# Patient Record
Sex: Male | Born: 1970 | Race: Black or African American | Hispanic: No | Smoking: Former smoker
Health system: Southern US, Community
[De-identification: ages and names within clinical notes are randomized; demographics above are authoritative.]

## PROBLEM LIST (undated history)

## (undated) ENCOUNTER — Ambulatory Visit (HOSPITAL_COMMUNITY): Admission: EM | Payer: 59 | Source: Home / Self Care

## (undated) DIAGNOSIS — I10 Essential (primary) hypertension: Secondary | ICD-10-CM

## (undated) DIAGNOSIS — M1990 Unspecified osteoarthritis, unspecified site: Secondary | ICD-10-CM

## (undated) DIAGNOSIS — F32A Depression, unspecified: Secondary | ICD-10-CM

## (undated) DIAGNOSIS — M5430 Sciatica, unspecified side: Secondary | ICD-10-CM

## (undated) DIAGNOSIS — G4730 Sleep apnea, unspecified: Secondary | ICD-10-CM

## (undated) DIAGNOSIS — F1910 Other psychoactive substance abuse, uncomplicated: Secondary | ICD-10-CM

## (undated) DIAGNOSIS — M549 Dorsalgia, unspecified: Secondary | ICD-10-CM

## (undated) DIAGNOSIS — F329 Major depressive disorder, single episode, unspecified: Secondary | ICD-10-CM

## (undated) DIAGNOSIS — J45909 Unspecified asthma, uncomplicated: Secondary | ICD-10-CM

## (undated) HISTORY — DX: Unspecified osteoarthritis, unspecified site: M19.90

## (undated) HISTORY — DX: Sleep apnea, unspecified: G47.30

## (undated) HISTORY — PX: OTHER SURGICAL HISTORY: SHX169

## (undated) HISTORY — DX: Other psychoactive substance abuse, uncomplicated: F19.10

---
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
## 2008-07-12 ENCOUNTER — Emergency Department (HOSPITAL_COMMUNITY): Admission: EM | Admit: 2008-07-12 | Discharge: 2008-07-12 | Payer: Self-pay | Admitting: Emergency Medicine

## 2009-04-20 ENCOUNTER — Ambulatory Visit (HOSPITAL_COMMUNITY): Admission: RE | Admit: 2009-04-20 | Discharge: 2009-04-20 | Payer: Self-pay | Admitting: Orthopedic Surgery

## 2009-04-20 ENCOUNTER — Emergency Department (HOSPITAL_COMMUNITY): Admission: EM | Admit: 2009-04-20 | Discharge: 2009-04-20 | Payer: Self-pay | Admitting: Emergency Medicine

## 2010-05-01 LAB — BASIC METABOLIC PANEL
BUN: 9 mg/dL (ref 6–23)
Glucose, Bld: 90 mg/dL (ref 70–99)

## 2010-05-01 LAB — CBC
HCT: 39.5 % (ref 39.0–52.0)
Hemoglobin: 13.2 g/dL (ref 13.0–17.0)
RBC: 4.44 MIL/uL (ref 4.22–5.81)

## 2010-05-01 LAB — CULTURE, ROUTINE-ABSCESS

## 2010-05-01 LAB — ANAEROBIC CULTURE

## 2010-08-17 ENCOUNTER — Emergency Department (HOSPITAL_COMMUNITY): Payer: Self-pay

## 2010-08-17 ENCOUNTER — Emergency Department (HOSPITAL_COMMUNITY)
Admission: EM | Admit: 2010-08-17 | Discharge: 2010-08-17 | Payer: Self-pay | Attending: Emergency Medicine | Admitting: Emergency Medicine

## 2010-08-17 DIAGNOSIS — R05 Cough: Secondary | ICD-10-CM | POA: Insufficient documentation

## 2010-08-17 DIAGNOSIS — J069 Acute upper respiratory infection, unspecified: Secondary | ICD-10-CM | POA: Insufficient documentation

## 2010-08-17 DIAGNOSIS — J40 Bronchitis, not specified as acute or chronic: Secondary | ICD-10-CM | POA: Insufficient documentation

## 2010-08-17 DIAGNOSIS — R059 Cough, unspecified: Secondary | ICD-10-CM | POA: Insufficient documentation

## 2010-08-17 DIAGNOSIS — J029 Acute pharyngitis, unspecified: Secondary | ICD-10-CM | POA: Insufficient documentation

## 2010-08-17 LAB — RAPID STREP SCREEN (MED CTR MEBANE ONLY): Streptococcus, Group A Screen (Direct): NEGATIVE

## 2011-04-03 ENCOUNTER — Emergency Department (HOSPITAL_COMMUNITY)
Admission: EM | Admit: 2011-04-03 | Discharge: 2011-04-03 | Disposition: A | Discharge status: Home / Self Care | Payer: Self-pay | Attending: Emergency Medicine | Admitting: Emergency Medicine

## 2011-04-03 ENCOUNTER — Encounter (HOSPITAL_COMMUNITY): Payer: Self-pay | Admitting: *Deleted

## 2011-04-03 ENCOUNTER — Emergency Department (HOSPITAL_COMMUNITY): Payer: Self-pay

## 2011-04-03 DIAGNOSIS — M545 Low back pain, unspecified: Secondary | ICD-10-CM | POA: Insufficient documentation

## 2011-04-03 DIAGNOSIS — J45909 Unspecified asthma, uncomplicated: Secondary | ICD-10-CM | POA: Insufficient documentation

## 2011-04-03 DIAGNOSIS — F17200 Nicotine dependence, unspecified, uncomplicated: Secondary | ICD-10-CM | POA: Insufficient documentation

## 2011-04-03 MED ORDER — METHOCARBAMOL 500 MG PO TABS: 1000.0000 mg | ORAL_TABLET | Freq: Four times a day (QID) | ORAL | Status: AC | PRN

## 2011-04-03 MED ORDER — NAPROXEN 250 MG PO TABS: 250.0000 mg | ORAL_TABLET | Freq: Two times a day (BID) | ORAL | Status: DC

## 2011-04-03 MED ORDER — HYDROCODONE-ACETAMINOPHEN 5-325 MG PO TABS: ORAL_TABLET | ORAL | Status: AC

## 2011-04-03 NOTE — ED Provider Notes (Signed)
History     CSN: [id number]  Arrival date & time 04/03/11  1045   First MD Initiated Contact with Patient 04/03/11 1114      Chief Complaint  Patient presents with  . Back Pain     HPI Pt was seen at 1110.  Per pt, c/o gradual onset and persistence of constant acute flair of his chronic low back "pain" for the past several days.  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  State the Prison MD treated him with muscle relaxant and motrin with partial relief of his symptoms.  Staets the pain with occas radiate down his left leg.  Describes the pain as "sharp."  Denies any new injury.  Denies incont/retention of bowel or bladder, no saddle anesthesia, no focal motor weakness, no tingling/numbness in extremities, no fevers, no injury.   The symptoms have been associated with no other complaints.    Past Medical History  Diagnosis Date  . Asthma     History reviewed. No pertinent past surgical history.  History  Substance Use Topics  . Smoking status: Current Everyday Smoker  . Smokeless tobacco: Not on file  . Alcohol Use: Yes    Review of Systems ROS: Statement: All systems negative except as marked or noted in the HPI; Constitutional: Negative for fever and chills. ; ; Eyes: Negative for eye pain, redness and discharge. ; ; ENMT: Negative for ear pain, hoarseness, nasal congestion, sinus pressure and sore throat. ; ; Cardiovascular: Negative for chest pain, palpitations, diaphoresis, dyspnea and peripheral edema. ; ; Respiratory: Negative for cough, wheezing and stridor. ; ; Gastrointestinal: Negative for nausea, vomiting, diarrhea, abdominal pain, blood in stool, hematemesis, jaundice and rectal bleeding. . ; ; Genitourinary: Negative for dysuria, flank pain and hematuria. ; ; Musculoskeletal: +LBP.  Negative for neck pain. Negative for swelling and trauma.; ; Skin: Negative for pruritus, rash, abrasions, blisters, bruising and skin lesion.; ;  Neuro: Negative for headache, lightheadedness and neck stiffness. Negative for weakness, altered level of consciousness , altered mental status, extremity weakness, paresthesias, involuntary movement, seizure and syncope.     Allergies  Review of patient's allergies indicates no known allergies.  Home Medications   Current Outpatient Rx  Name Route Sig Dispense Refill  . ALBUTEROL SULFATE HFA 108 (90 BASE) MCG/ACT IN AERS Inhalation Inhale 4 puffs into the lungs every 6 (six) hours as needed. For shortness of breath/wheezing    . CYCLOBENZAPRINE HCL 5 MG PO TABS Oral Take 5 mg by mouth 3 (three) times daily as needed. For spasms    . IBUPROFEN 200 MG PO TABS Oral Take 1,000 mg by mouth every 8 (eight) hours as needed. For pain    . TRAMADOL HCL 50 MG PO TABS Oral Take 50 mg by mouth every 6 (six) hours as needed. For pain      BP 139/89  Pulse 79  Temp(Src) 98.3 F (36.8 C) (Oral)  Resp 20  Ht 5\' 9"  (1.753 m)  Wt 150 lb (68.04 kg)  BMI 22.15 kg/m2  SpO2 98%  Physical Exam 1115: Physical examination:  Nursing notes reviewed; Vital signs and O2 SAT reviewed;  Constitutional: Well developed, Well nourished, Well hydrated, In no acute distress; Head:  Normocephalic, atraumatic; Eyes: EOMI, PERRL, No scleral icterus; ENMT: Mouth and pharynx normal, Mucous membranes moist; Neck: Supple, Full range of motion, No lymphadenopathy; Cardiovascular: Regular rate and rhythm, No murmur, rub, or gallop; Respiratory: Breath sounds clear &  equal bilaterally, No rales, rhonchi, wheezes, or rub, Normal respiratory effort/excursion; Chest: Nontender, Movement normal; Abdomen: Soft, Nontender, Nondistended, Normal bowel sounds; Genitourinary: No CVA tenderness; Spine:  No midline CS, TS, LS tenderness. +TTP left lumbar hypertonic paraspinal muscles.; Extremities: Pulses normal, No tenderness, No edema, No calf edema or asymmetry.; Neuro: AA&Ox3, Major CN grossly intact. Gait steady, Strength 5/5 equal bilat  UE's and LE's, including great toe dorsiflexion.  DTR 2/4 equal bilat UE's and LE's.  No gross sensory deficits.  Neg straight leg raises bilat.; Skin: Color normal, Warm, Dry, no rash.    ED Course  Procedures    MDM  MDM Reviewed: nursing note and vitals Interpretation: x-ray    Dg Lumbar Spine Complete 04/03/2011  *RADIOLOGY REPORT*  Clinical Data: Weight lifting injury.  Low back pain.  LUMBAR SPINE - COMPLETE 4+ VIEW  Comparison: None.  Findings: Vertebral body height and alignment are normal. Intervertebral disc space height is maintained.  No pars interarticularis defect is seen.  Paraspinous structures are unremarkable.  Mild convex left curvature may be positional.  IMPRESSION: Negative exam.  Original Report Authenticated By: Bernadene Bell. D'ALESSIO, M.D.     12:35 PM:  No focal motor weakness or sensory deficit on today's exam.  No concerning signs for cauda equina today.  XR without acute bony abnl.  Symptoms have been present for the past month.  Will continue to treat symptomatically and refer to Neurosurgery.  Dx testing d/w pt.  Questions answered.  Verb understanding, agreeable to d/c home with outpt f/u.           Laray Anger, DO 04/05/11 1622

## 2011-04-03 NOTE — ED Notes (Signed)
Patient states he was doing dead weight lifts and felt something "explode in his back" approx 1 mth ago.  He is also experiencing problems in his left leg,  States it hurts and sometimes goes numb.  Patient also has pain when he bends his neck.

## 2011-04-24 ENCOUNTER — Encounter (HOSPITAL_COMMUNITY): Payer: Self-pay | Admitting: Emergency Medicine

## 2011-04-24 ENCOUNTER — Emergency Department (HOSPITAL_COMMUNITY)
Admission: EM | Admit: 2011-04-24 | Discharge: 2011-04-24 | Disposition: A | Discharge status: Home / Self Care | Payer: Self-pay | Attending: Emergency Medicine | Admitting: Emergency Medicine

## 2011-04-24 DIAGNOSIS — M546 Pain in thoracic spine: Secondary | ICD-10-CM

## 2011-04-24 DIAGNOSIS — J45909 Unspecified asthma, uncomplicated: Secondary | ICD-10-CM | POA: Insufficient documentation

## 2011-04-24 DIAGNOSIS — M549 Dorsalgia, unspecified: Secondary | ICD-10-CM | POA: Insufficient documentation

## 2011-04-24 DIAGNOSIS — F17200 Nicotine dependence, unspecified, uncomplicated: Secondary | ICD-10-CM | POA: Insufficient documentation

## 2011-04-24 MED ORDER — OXYCODONE-ACETAMINOPHEN 5-325 MG PO TABS
1.0000 | ORAL_TABLET | Freq: Once | ORAL | Status: AC
  Administered 2011-04-24: 1 via ORAL
  Filled 2011-04-24: qty 1

## 2011-04-24 MED ORDER — METHOCARBAMOL 500 MG PO TABS: 500.0000 mg | ORAL_TABLET | Freq: Two times a day (BID) | ORAL | Status: DC

## 2011-04-24 MED ORDER — OXYCODONE-ACETAMINOPHEN 5-325 MG PO TABS
2.0000 | ORAL_TABLET | ORAL | Status: DC | PRN
Start: 2011-04-24 — End: 2011-05-02

## 2011-04-24 NOTE — ED Provider Notes (Signed)
History     CSN: [id number]  Arrival date & time 04/24/11  1120   First MD Initiated Contact with Patient 04/24/11 1232      Chief Complaint  Patient presents with  . Back Pain    (Consider location/radiation/quality/duration/timing/severity/associated sxs/prior treatment) Patient is a 41 y.o. male presenting with back pain. The history is provided by the patient and medical records.  Back Pain  Pertinent negatives include no numbness, no headaches and no weakness.   the patient is a 41 year old, male that presents to the emergency department complaining of persistent left upper back pain.  He states that his symptoms started a few months ago, when he was lifting weights.  He states that the pain increases, if he lays on his back, if he moves his left arm, or if he flexes his neck.  He denies recurrent injury.  Past Medical History  Diagnosis Date  . Asthma     History reviewed. No pertinent past surgical history.  No family history on file.  History  Substance Use Topics  . Smoking status: Current Everyday Smoker  . Smokeless tobacco: Not on file  . Alcohol Use: Yes      Review of Systems  HENT: Negative for neck pain.   Musculoskeletal: Positive for back pain.  Neurological: Negative for weakness, numbness and headaches.  All other systems reviewed and are negative.    Allergies  Review of patient's allergies indicates no known allergies.  Home Medications   Current Outpatient Rx  Name Route Sig Dispense Refill  . ALBUTEROL SULFATE HFA 108 (90 BASE) MCG/ACT IN AERS Inhalation Inhale 2 puffs into the lungs every 6 (six) hours as needed. For shortness of breath/wheezing      BP 134/114  Pulse 111  Temp 98.2 F (36.8 C)  Resp 16  SpO2 97%  Physical Exam  Vitals reviewed. Constitutional: He is oriented to person, place, and time. He appears well-developed and well-nourished.  HENT:  Head: Normocephalic.  Eyes: Pupils are equal, round, and reactive to  light.  Neck: Normal range of motion. Neck supple.  Pulmonary/Chest: Effort normal.  Abdominal: He exhibits no distension.  Musculoskeletal: Normal range of motion. He exhibits no edema.       Tenderness along the left trapezius muscles and left paraspinal muscles. Pain in his back.  Increases when he flexes, neck or if he moves his left upper extremity.  Neurological: He is alert and oriented to person, place, and time.  Skin: Skin is warm and dry.  Psychiatric: He has a normal mood and affect. Thought content normal.    ED Course  Procedures (including critical care time) Muscular back pain for approximately 2 weeks after lifting weights.  No recurrent injuries.  No systemic illness or symptoms suggestive of intrathoracic etiology for his pain.  No tests indicated in the emergency department.  We'll give analgesics, and released  Labs Reviewed - No data to display No results found.   No diagnosis found.    MDM  Muscular back pain        Cheri Guppy, MD 04/24/11 1250

## 2011-04-24 NOTE — Discharge Instructions (Signed)
Use ibuprofen 600 mg every 6 hours for pain.  Use Percocet for more severe pain.  Use Robaxin oral muscle relaxation.  Followup with health service for reevaluation

## 2011-04-24 NOTE — ED Notes (Signed)
Back pain left back shoulder  X 6 months got bad a couple of days ago and arm went numb

## 2011-04-24 NOTE — ED Notes (Signed)
Hurts to sleep on back side or abd it hurts so bad

## 2011-05-02 ENCOUNTER — Encounter (HOSPITAL_COMMUNITY): Payer: Self-pay | Admitting: *Deleted

## 2011-05-02 ENCOUNTER — Emergency Department (HOSPITAL_COMMUNITY): Payer: Self-pay

## 2011-05-02 ENCOUNTER — Emergency Department (HOSPITAL_COMMUNITY)
Admission: EM | Admit: 2011-05-02 | Discharge: 2011-05-02 | Disposition: A | Discharge status: Home / Self Care | Payer: Self-pay | Attending: Emergency Medicine | Admitting: Emergency Medicine

## 2011-05-02 DIAGNOSIS — R209 Unspecified disturbances of skin sensation: Secondary | ICD-10-CM | POA: Insufficient documentation

## 2011-05-02 DIAGNOSIS — M542 Cervicalgia: Secondary | ICD-10-CM | POA: Insufficient documentation

## 2011-05-02 DIAGNOSIS — J45909 Unspecified asthma, uncomplicated: Secondary | ICD-10-CM | POA: Insufficient documentation

## 2011-05-02 DIAGNOSIS — M5412 Radiculopathy, cervical region: Secondary | ICD-10-CM | POA: Insufficient documentation

## 2011-05-02 MED ORDER — OXYCODONE-ACETAMINOPHEN 5-325 MG PO TABS: 1.0000 | ORAL_TABLET | ORAL | Status: AC | PRN

## 2011-05-02 MED ORDER — IBUPROFEN 600 MG PO TABS: 600.0000 mg | ORAL_TABLET | Freq: Three times a day (TID) | ORAL | Status: AC | PRN

## 2011-05-02 MED ORDER — HYDROMORPHONE HCL PF 1 MG/ML IJ SOLN
1.0000 mg | Freq: Once | INTRAMUSCULAR | Status: AC
  Administered 2011-05-02: 1 mg via INTRAMUSCULAR
  Filled 2011-05-02: qty 1

## 2011-05-02 MED ORDER — OXYCODONE-ACETAMINOPHEN 5-325 MG PO TABS
2.0000 | ORAL_TABLET | Freq: Once | ORAL | Status: AC
  Administered 2011-05-02: 2 via ORAL
  Filled 2011-05-02: qty 2

## 2011-05-02 MED ORDER — IBUPROFEN 200 MG PO TABS
600.0000 mg | ORAL_TABLET | Freq: Once | ORAL | Status: AC
  Administered 2011-05-02: 600 mg via ORAL
  Filled 2011-05-02: qty 3

## 2011-05-02 NOTE — Discharge Instructions (Signed)
Cervical Radiculopathy Cervical radiculopathy happens when a nerve in the neck is pinched or bruised by a slipped (herniated) disk or by arthritic changes in the bones of the cervical spine. This can occur due to an injury or as part of the normal aging process. Pressure on the cervical nerves can cause pain or numbness that runs from your neck all the way down into your arm and fingers. CAUSES  There are many possible causes, including:  Injury.   Muscle tightness in the neck from overuse.   Swollen, painful joints (arthritis).   Breakdown or degeneration in the bones and joints of the spine (spondylosis) due to aging.   Bone spurs that may develop near the cervical nerves.  SYMPTOMS  Symptoms include pain, weakness, or numbness in the affected arm and hand. Pain can be severe or irritating. Symptoms may be worse when extending or turning the neck. DIAGNOSIS  Your caregiver will ask about your symptoms and do a physical exam. He or she may test your strength and reflexes. X-rays, CT scans, and MRI scans may be needed in cases of injury or if the symptoms do not go away after a period of time. Electromyography (EMG) or nerve conduction testing may be done to study how your nerves and muscles are working. TREATMENT  Your caregiver may recommend certain exercises to help relieve your symptoms. Cervical radiculopathy can, and often does, get better with time and treatment. If your problems continue, treatment options may include:  Wearing a soft collar for short periods of time.   Physical therapy to strengthen the neck muscles.   Medicines, such as nonsteroidal anti-inflammatory drugs (NSAIDs), oral corticosteroids, or spinal injections.   Surgery. Different types of surgery may be done depending on the cause of your problems.  HOME CARE INSTRUCTIONS   Put ice on the affected area.   Put ice in a plastic bag.   Place a towel between your skin and the bag.   Leave the ice on for 15  to 20 minutes, 3 to 4 times a day or as directed by your caregiver.   Use a flat pillow when you sleep.   Only take over-the-counter or prescription medicines for pain, discomfort, or fever as directed by your caregiver.   If physical therapy was prescribed, follow your caregiver's directions.   If a soft collar was prescribed, use it as directed.  SEEK IMMEDIATE MEDICAL CARE IF:   Your pain gets much worse and cannot be controlled with medicines.   You have weakness or numbness in your hand, arm, face, or leg.   You have a high fever or a stiff, rigid neck.   You lose bowel or bladder control (incontinence).   You have trouble with walking, balance, or speaking.  MAKE SURE YOU:   Understand these instructions.   Will watch your condition.   Will get help right away if you are not doing well or get worse.  Document Released: 10/17/2000 Document Revised: 01/11/2011 Document Reviewed: 09/05/2010 [employer] Patient Information 2012 [employer]. 

## 2011-05-02 NOTE — ED Notes (Signed)
Pt sts injured left upper back 3 months ago with lifting weight and has been seen here and prescribed methocarbamol and oxycodone which relived pain.  Pt is out of meds and is hurting.  Pt has pain with lifting or moving arm.  Pulse present

## 2011-05-02 NOTE — ED Provider Notes (Signed)
History     CSN: [id number]  Arrival date & time 05/02/11  5284   First MD Initiated Contact with Patient 05/02/11 [id number]      Chief Complaint  Patient presents with  . Back Pain    upper left back and hurts to move arm times 3 months    (Consider location/radiation/quality/duration/timing/severity/associated sxs/prior treatment) The history is provided by the patient.   the patient reports an acute pain in his posterior neck approximately 3 months ago when he was doing pull-ups.  Since then he has continued having pain in his neck as well as shooting pain radiating down his left arm with occasional numbness of his left arm.  He reports his pain is worsened by turning of his head left and right and up and down.  It is improved by nothing.  He's been seen in the ER only once he's had no imaging done of his neck.  He denies weakness of his upper and lower extremity his.  He has no bowel or bladder complaints.  He has no fevers or chills.  He denies weight loss.  He has no history of cancer.  He denies IV drug abuse.  Nothing improves his symptoms.  His pain is moderate to severe at this time.  He does not have a primary care physician  Past Medical History  Diagnosis Date  . Asthma     History reviewed. No pertinent past surgical history.  No family history on file.  History  Substance Use Topics  . Smoking status: Current Everyday Smoker  . Smokeless tobacco: Not on file  . Alcohol Use: Yes      Review of Systems  Musculoskeletal: Positive for back pain.  All other systems reviewed and are negative.    Allergies  Review of patient's allergies indicates no known allergies.  Home Medications   Current Outpatient Rx  Name Route Sig Dispense Refill  . ALBUTEROL SULFATE HFA 108 (90 BASE) MCG/ACT IN AERS Inhalation Inhale 2 puffs into the lungs every 6 (six) hours as needed. For shortness of breath/wheezing      BP 153/103  Pulse 92  Temp(Src) 97.2 F (36.2 C) (Oral)   Resp 20  SpO2 97%  Physical Exam  Nursing note and vitals reviewed. Constitutional: He is oriented to person, place, and time. He appears well-developed and well-nourished.  HENT:  Head: Normocephalic and atraumatic.  Eyes: EOM are normal.  Neck: Normal range of motion. Neck supple.       Mild cervical spine tenderness without step-offs.  Cardiovascular: Normal rate, regular rhythm, normal heart sounds and intact distal pulses.   Pulmonary/Chest: Effort normal and breath sounds normal. No respiratory distress.  Abdominal: Soft. He exhibits no distension. There is no tenderness.  Musculoskeletal: Normal range of motion.       5 out of 5 strength in bilateral upper lower extremity major muscle groups  Lymphadenopathy:    He has no cervical adenopathy.  Neurological: He is alert and oriented to person, place, and time.  Skin: Skin is warm and dry.  Psychiatric: He has a normal mood and affect. Judgment normal.    ED Course  Procedures (including critical care time)  Labs Reviewed - No data to display Dg Cervical Spine Complete  05/02/2011  *RADIOLOGY REPORT*  Clinical Data: Posterior neck pain and arm pain.  CERVICAL SPINE - COMPLETE 4+ VIEW  Comparison: None.  Findings: The cervical spine is visualized from the occiput to the cervicothoracic junction.  Slight reversal  of the normal cervical lordosis.  Alignment is otherwise anatomic.  Vertebral body and disc space height are maintained, with the exception of slight narrowing at C6-7.  Prevertebral soft tissues are within normal limits.  Dens is obscured on the dedicated views.  Visualized lung apices are clear.  IMPRESSION: Slight reversal of the normal cervical lordosis with minimal disc space narrowing at C6-7.  Original Report Authenticated By: Reyes Ivan, M.D.     1. Cervical radicular pain       MDM  The patient's symptoms sound like it may represent cervical radiculopathy.  No plain films of the C-spine of ever being  completed.  He did be completed today.  His pain is treated this time.  He likely will need an outpatient MRI scan of his cervical spine.  I will also refer the patient to neurosurgery for evaluation        Lyanne Co, MD 05/02/11 2247

## 2011-05-24 ENCOUNTER — Encounter (HOSPITAL_COMMUNITY): Payer: Self-pay | Admitting: Emergency Medicine

## 2011-05-24 ENCOUNTER — Emergency Department (HOSPITAL_COMMUNITY)
Admission: EM | Admit: 2011-05-24 | Discharge: 2011-05-24 | Disposition: A | Discharge status: Home / Self Care | Payer: Self-pay | Attending: Emergency Medicine | Admitting: Emergency Medicine

## 2011-05-24 DIAGNOSIS — J45909 Unspecified asthma, uncomplicated: Secondary | ICD-10-CM | POA: Insufficient documentation

## 2011-05-24 DIAGNOSIS — F17200 Nicotine dependence, unspecified, uncomplicated: Secondary | ICD-10-CM | POA: Insufficient documentation

## 2011-05-24 DIAGNOSIS — M5430 Sciatica, unspecified side: Secondary | ICD-10-CM | POA: Insufficient documentation

## 2011-05-24 MED ORDER — NAPROXEN 500 MG PO TABS: 500.0000 mg | ORAL_TABLET | Freq: Two times a day (BID) | ORAL | Status: DC

## 2011-05-24 MED ORDER — OXYCODONE-ACETAMINOPHEN 5-325 MG PO TABS: 1.0000 | ORAL_TABLET | Freq: Four times a day (QID) | ORAL | Status: AC | PRN

## 2011-05-24 MED ORDER — OXYCODONE-ACETAMINOPHEN 5-325 MG PO TABS
1.0000 | ORAL_TABLET | Freq: Once | ORAL | Status: AC
  Administered 2011-05-24: 1 via ORAL
  Filled 2011-05-24: qty 1

## 2011-05-24 NOTE — Discharge Instructions (Signed)
RESOURCE GUIDE  Dental Problems  Patients with Medicaid: [facility]                     [facility] [address]                                           [address] Phone:  [phone]                                                  Phone:  [phone]  If unable to pay or uninsured, contact:  [facility] or [facility] to become qualified for the adult dental clinic.  Chronic Pain Problems Contact [facility]  [phone] Patients need to be referred by their primary care doctor.  Insufficient Money for Medicine Contact [employer]:  call "211" or [facility] 271-5999.  No Primary Care Doctor Call [facility]  832-8000 Other agencies that provide inexpensive medical care    [facility]  [phone]    [facility]  [phone]    [facility]  [phone]    Women's Clinic  832-4777    [facility]  [phone]    [facility]  [phone]  Psychological Services [facility]  [phone] [facility]  [phone] [facility]   800 853-5163 (emergency services 641-4993)  Substance Abuse Resources [facility]  336-882-2125 [facility] 336-784-9470 [facility] 336-285-9073 [facility] 336-845-3988 Residential & Outpatient Substance Abuse Program  800-659-3381  Abuse/Neglect [place] Child Abuse Hotline (336) 641-3795 [place] Child Abuse Hotline 800-378-5315 (After Hours)  Emergency Shelter [facility] (336) 271-5985  Maternity Homes [facility] (336) 275-9566 [facility] (704) 372-4663  MRSA Hotline #:   832-7006    [place] Resources  [facility] [place]     [employer]                          [facility] [address] [place]                       [address]      [address]  [place]                                                [place]                            [place] Phone:  [phone]                                   Phone:  [phone]                 Phone:  [phone]  [facility] Phone:  [phone]    [facility] Child Abuse Hotline (213) 478-7225 865-102-1638 (After Hours)  Take medications as directed resource guide provided to find a primary care provider. Also recommend considering evaluation at [facility] in [place] in maybe a to move you into their clinics. With his sciatica ongoing now since November on and off may require surgery.

## 2011-05-24 NOTE — ED Notes (Signed)
C/o low back pain since last night, hx of same, states pain is worse after doing yard work, no other complaints, ambulatory to triage, NAD

## 2011-05-24 NOTE — ED Provider Notes (Signed)
History     CSN: [id number]  Arrival date & time 05/24/11  1026   First MD Initiated Contact with Patient 05/24/11 1110      Chief Complaint  Patient presents with  . Back Pain    (Consider location/radiation/quality/duration/timing/severity/associated sxs/prior treatment) Patient is a 41 y.o. male presenting with back pain.  Back Pain  This is a recurrent problem. The current episode started 2 days ago. The problem occurs constantly. The problem has not changed since onset.The pain is associated with no known injury. The pain is present in the lumbar spine. The quality of the pain is described as aching and shooting. The pain radiates to the left thigh. The pain is at a severity of 8/10. The pain is moderate. The symptoms are aggravated by bending, twisting and certain positions. Associated symptoms include leg pain. Pertinent negatives include no chest pain, no fever, no numbness, no headaches, no abdominal pain, no bowel incontinence, no perianal numbness, no bladder incontinence, no dysuria, no pelvic pain, no paresthesias, no paresis, no tingling and no weakness.    Past Medical History  Diagnosis Date  . Asthma     History reviewed. No pertinent past surgical history.  No family history on file.  History  Substance Use Topics  . Smoking status: Current Everyday Smoker  . Smokeless tobacco: Not on file  . Alcohol Use: Yes      Review of Systems  Constitutional: Negative for fever.  HENT: Negative for neck pain and neck stiffness.   Eyes: Negative for visual disturbance.  Cardiovascular: Negative for chest pain.  Gastrointestinal: Negative for abdominal pain and bowel incontinence.  Genitourinary: Negative for bladder incontinence, dysuria and pelvic pain.  Musculoskeletal: Positive for back pain.  Skin: Negative for rash.  Neurological: Negative for tingling, weakness, numbness, headaches and paresthesias.    Allergies  Review of patient's allergies indicates  no known allergies.  Home Medications   Current Outpatient Rx  Name Route Sig Dispense Refill  . ALBUTEROL SULFATE HFA 108 (90 BASE) MCG/ACT IN AERS Inhalation Inhale 2 puffs into the lungs every 6 (six) hours as needed. For shortness of breath/wheezing    . NAPROXEN 500 MG PO TABS Oral Take 1 tablet (500 mg total) by mouth 2 (two) times daily. 14 tablet 0  . OXYCODONE-ACETAMINOPHEN 5-325 MG PO TABS Oral Take 1 tablet by mouth every 6 (six) hours as needed for pain. 10 tablet 0    BP 126/87  Pulse 95  Temp(Src) 97.5 F (36.4 C) (Oral)  Resp 20  SpO2 96%  Physical Exam  Nursing note and vitals reviewed. Constitutional: He is oriented to person, place, and time. He appears well-developed and well-nourished. No distress.  HENT:  Head: Normocephalic and atraumatic.  Eyes: Conjunctivae and EOM are normal. Pupils are equal, round, and reactive to light.  Neck: Normal range of motion. Neck supple.  Cardiovascular: Normal rate, regular rhythm and normal heart sounds.   Pulmonary/Chest: Effort normal and breath sounds normal.  Abdominal: Soft. Bowel sounds are normal. There is no tenderness.  Musculoskeletal: Normal range of motion. He exhibits no edema and no tenderness.       Pain with movement of the left leg. The pain is located in the left lower part of the back.  Neurological: He is alert and oriented to person, place, and time. No cranial nerve deficit. He exhibits normal muscle tone. Coordination normal.  Skin: No rash noted.    ED Course  Procedures (including critical care time)  Labs Reviewed - No data to display No results found.   1. Sciatica       MDM   Patient with sciatica-type symptoms to the left leg. No sensory or motor deficit the pain radiating into that area. Patient has been having multiple back problems since November. This is for physical for either neck or back problems. Patient working on getting a primary care provider. Will treat with limited her  chronic pain medicine and full Naprosyn resource guide provided. Also [facility]        Shelda Jakes, MD 05/24/11 1149

## 2011-09-21 ENCOUNTER — Emergency Department (HOSPITAL_COMMUNITY)
Admission: EM | Admit: 2011-09-21 | Discharge: 2011-09-21 | Disposition: A | Discharge status: Home / Self Care | Payer: Self-pay | Attending: Emergency Medicine | Admitting: Emergency Medicine

## 2011-09-21 ENCOUNTER — Emergency Department (HOSPITAL_COMMUNITY): Payer: Self-pay

## 2011-09-21 ENCOUNTER — Encounter (HOSPITAL_COMMUNITY): Payer: Self-pay

## 2011-09-21 DIAGNOSIS — W19XXXA Unspecified fall, initial encounter: Secondary | ICD-10-CM | POA: Insufficient documentation

## 2011-09-21 DIAGNOSIS — G8929 Other chronic pain: Secondary | ICD-10-CM | POA: Insufficient documentation

## 2011-09-21 DIAGNOSIS — M549 Dorsalgia, unspecified: Secondary | ICD-10-CM | POA: Insufficient documentation

## 2011-09-21 DIAGNOSIS — J45909 Unspecified asthma, uncomplicated: Secondary | ICD-10-CM | POA: Insufficient documentation

## 2011-09-21 DIAGNOSIS — F17200 Nicotine dependence, unspecified, uncomplicated: Secondary | ICD-10-CM | POA: Insufficient documentation

## 2011-09-21 MED ORDER — OXYCODONE-ACETAMINOPHEN 5-325 MG PO TABS: 1.0000 | ORAL_TABLET | Freq: Four times a day (QID) | ORAL | Status: AC | PRN

## 2011-09-21 MED ORDER — OXYCODONE-ACETAMINOPHEN 5-325 MG PO TABS
2.0000 | ORAL_TABLET | Freq: Once | ORAL | Status: AC
  Administered 2011-09-21: 2 via ORAL
  Filled 2011-09-21: qty 2

## 2011-09-21 MED ORDER — PREDNISONE 20 MG PO TABS: 60.0000 mg | ORAL_TABLET | Freq: Every day | ORAL | Status: AC

## 2011-09-21 NOTE — ED Notes (Signed)
Pt here or back pain, since Wednesday when playing with children. sts worse last night and worse to move.
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
## 2012-10-20 ENCOUNTER — Emergency Department (HOSPITAL_COMMUNITY)
Admission: EM | Admit: 2012-10-20 | Discharge: 2012-10-20 | Disposition: A | Discharge status: Home / Self Care | Payer: Self-pay | Attending: Emergency Medicine | Admitting: Emergency Medicine

## 2012-10-20 ENCOUNTER — Encounter (HOSPITAL_COMMUNITY): Payer: Self-pay | Admitting: *Deleted

## 2012-10-20 DIAGNOSIS — F17200 Nicotine dependence, unspecified, uncomplicated: Secondary | ICD-10-CM | POA: Insufficient documentation

## 2012-10-20 DIAGNOSIS — M5432 Sciatica, left side: Secondary | ICD-10-CM

## 2012-10-20 DIAGNOSIS — R209 Unspecified disturbances of skin sensation: Secondary | ICD-10-CM | POA: Insufficient documentation

## 2012-10-20 DIAGNOSIS — Z79899 Other long term (current) drug therapy: Secondary | ICD-10-CM | POA: Insufficient documentation

## 2012-10-20 DIAGNOSIS — J45909 Unspecified asthma, uncomplicated: Secondary | ICD-10-CM | POA: Insufficient documentation

## 2012-10-20 DIAGNOSIS — M5430 Sciatica, unspecified side: Secondary | ICD-10-CM | POA: Insufficient documentation

## 2012-10-20 HISTORY — DX: Sciatica, unspecified side: M54.30

## 2012-10-20 MED ORDER — HYDROCODONE-ACETAMINOPHEN 5-325 MG PO TABS: 1.0000 | ORAL_TABLET | Freq: Four times a day (QID) | ORAL | Status: DC | PRN

## 2012-10-20 MED ORDER — CYCLOBENZAPRINE HCL 10 MG PO TABS: 10.0000 mg | ORAL_TABLET | Freq: Three times a day (TID) | ORAL | Status: DC | PRN

## 2012-10-20 MED ORDER — PREDNISONE 50 MG PO TABS: 50.0000 mg | ORAL_TABLET | Freq: Every day | ORAL | Status: DC

## 2012-10-20 MED ORDER — HYDROCODONE-ACETAMINOPHEN 5-325 MG PO TABS
1.0000 | ORAL_TABLET | Freq: Once | ORAL | Status: AC
  Administered 2012-10-20: 1 via ORAL
  Filled 2012-10-20: qty 1

## 2012-10-20 MED ORDER — IBUPROFEN 400 MG PO TABS
800.0000 mg | ORAL_TABLET | Freq: Once | ORAL | Status: AC
  Administered 2012-10-20: 800 mg via ORAL
  Filled 2012-10-20: qty 2

## 2012-10-20 NOTE — ED Provider Notes (Signed)
CSN: [id number]     Arrival date & time 10/20/12  1937 History  This chart was scribed for non-physician practitioner Charlestine Night, PA-C, working with Flint Melter, MD by Dorothey Baseman, ED Scribe. This patient was seen in room TR06C/TR06C and the patient's care was started at 7:57 PM.    Chief Complaint  Patient presents with  . Sciatica   The history is provided by the patient. No language interpreter was used.   HPI Comments: Patrick Solomon is a 42 y.o. male who presents to the Emergency Department complaining of sciatica pain that radiates down to the left hip onset 4 days ago. He reports associated paresthesias to the left leg. He states that he has taken Ibuprofen at home without relief. He reports that the sciatica presented several years ago after an accident lifting weights and was given Flexeril with relief. He denies fever, dysuria, numbness, or any other symptoms. He denies any pertinent medical history or allergies to medications.   Past Medical History  Diagnosis Date  . Asthma   . Sciatica    No past surgical history on file. No family history on file. History  Substance Use Topics  . Smoking status: Current Every Day Smoker  . Smokeless tobacco: Not on file  . Alcohol Use: Yes    Review of Systems  A complete 10 system review of systems was obtained and all systems are negative except as noted in the HPI and PMH.   Allergies  Review of patient's allergies indicates no known allergies.  Home Medications   Current Outpatient Rx  Name  Route  Sig  Dispense  Refill  . albuterol (PROVENTIL HFA;VENTOLIN HFA) 108 (90 BASE) MCG/ACT inhaler   Inhalation   Inhale 2 puffs into the lungs every 6 (six) hours as needed. For shortness of breath/wheezing          Triage Vitals: BP 136/100  Pulse 116  Temp(Src) 98 F (36.7 C) (Oral)  Resp 18  Ht 5\' 9"  (1.753 m)  Wt 150 lb 2 oz (68.096 kg)  BMI 22.16 kg/m2  SpO2 99%  Physical Exam  Nursing note and vitals  reviewed. Constitutional: He is oriented to person, place, and time. He appears well-developed and well-nourished. No distress.  HENT:  Head: Normocephalic and atraumatic.  Eyes: Conjunctivae are normal. Pupils are equal, round, and reactive to light.  Neck: Normal range of motion. Neck supple.  Cardiovascular: Normal rate, regular rhythm and normal heart sounds.   Musculoskeletal: Normal range of motion.  Palpable left-sided, lower back pain.   Neurological: He is alert and oriented to person, place, and time. He has normal reflexes. He exhibits normal muscle tone. Coordination normal.  Normal strength and sensation throughout.  Skin: Skin is warm and dry. No rash noted.  Psychiatric: He has a normal mood and affect. His behavior is normal.    ED Course  Procedures (including critical care time)  Medications  ibuprofen (ADVIL,MOTRIN) tablet 800 mg (not administered)  HYDROcodone-acetaminophen (NORCO/VICODIN) 5-325 MG per tablet 1 tablet (not administered)   DIAGNOSTIC STUDIES: Oxygen Saturation is 99% on room air, normal by my interpretation.    COORDINATION OF CARE: 8:00PM- Advised patient to alternate heat and cold to the area. Will refer patient to an orthopaedist to follow up, especially if there are any new or worsening symptoms. Will order ibuprofen and Vicodin to manage pain symptoms. Discussed treatment plan with patient at bedside and patient verbalized agreement.      MDM  I  personally performed the services described in this documentation, which was scribed in my presence. The recorded information has been reviewed and is accurate.   Carlyle Dolly, PA-C 10/20/12 2022

## 2012-10-20 NOTE — ED Notes (Signed)
Pt reports "my sciatica nerve is flaring up again" - pt w/ left buttock/hip pain that radiates down his left leg, worse after doing work yesterday, taken ibuprofen w/o relief.

## 2012-10-20 NOTE — ED Notes (Addendum)
Pt. C/o "sciatica pain" from his L hip to his L foot.  Pt sts he was helping a friend move furniture  When the pain started. Pain score 8/10 unrelieved by ibuprofen. Pt also c/o tingling in his L foot and L thigh. Pt denies numbness, trouble urinating.  A&Ox4. NAD noted.

## 2012-10-21 NOTE — ED Provider Notes (Signed)
Medical screening examination/treatment/procedure(s) were performed by non-physician practitioner and as supervising physician I was immediately available for consultation/collaboration.  Flint Melter, MD 10/21/12 [phone]

## 2012-12-22 ENCOUNTER — Emergency Department (HOSPITAL_COMMUNITY)
Admission: EM | Admit: 2012-12-22 | Discharge: 2012-12-22 | Disposition: A | Discharge status: Home / Self Care | Payer: Self-pay | Attending: Emergency Medicine | Admitting: Emergency Medicine

## 2012-12-22 ENCOUNTER — Encounter (HOSPITAL_COMMUNITY): Payer: Self-pay | Admitting: Emergency Medicine

## 2012-12-22 ENCOUNTER — Emergency Department (HOSPITAL_COMMUNITY): Payer: Self-pay

## 2012-12-22 DIAGNOSIS — Z79899 Other long term (current) drug therapy: Secondary | ICD-10-CM | POA: Insufficient documentation

## 2012-12-22 DIAGNOSIS — IMO0002 Reserved for concepts with insufficient information to code with codable children: Secondary | ICD-10-CM | POA: Insufficient documentation

## 2012-12-22 DIAGNOSIS — S61451A Open bite of right hand, initial encounter: Secondary | ICD-10-CM

## 2012-12-22 DIAGNOSIS — R609 Edema, unspecified: Secondary | ICD-10-CM | POA: Insufficient documentation

## 2012-12-22 DIAGNOSIS — F17200 Nicotine dependence, unspecified, uncomplicated: Secondary | ICD-10-CM | POA: Insufficient documentation

## 2012-12-22 DIAGNOSIS — M5430 Sciatica, unspecified side: Secondary | ICD-10-CM | POA: Insufficient documentation

## 2012-12-22 DIAGNOSIS — L539 Erythematous condition, unspecified: Secondary | ICD-10-CM | POA: Insufficient documentation

## 2012-12-22 DIAGNOSIS — M5410 Radiculopathy, site unspecified: Secondary | ICD-10-CM

## 2012-12-22 DIAGNOSIS — J45909 Unspecified asthma, uncomplicated: Secondary | ICD-10-CM | POA: Insufficient documentation

## 2012-12-22 HISTORY — DX: Dorsalgia, unspecified: M54.9

## 2012-12-22 MED ORDER — AMOXICILLIN-POT CLAVULANATE 875-125 MG PO TABS
1.0000 | ORAL_TABLET | Freq: Once | ORAL | Status: AC
  Administered 2012-12-22: 1 via ORAL
  Filled 2012-12-22: qty 1

## 2012-12-22 MED ORDER — HYDROCODONE-ACETAMINOPHEN 5-325 MG PO TABS: 2.0000 | ORAL_TABLET | ORAL | Status: DC | PRN

## 2012-12-22 MED ORDER — AMOXICILLIN-POT CLAVULANATE 875-125 MG PO TABS: 1.0000 | ORAL_TABLET | Freq: Two times a day (BID) | ORAL | Status: DC
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
## 2012-12-22 NOTE — ED Provider Notes (Signed)
Medical screening examination/treatment/procedure(s) were performed by non-physician practitioner and as supervising physician I was immediately available for consultation/collaboration.  EKG Interpretation   None         Laray Anger, DO 12/22/12 1714

## 2013-04-06 ENCOUNTER — Emergency Department (HOSPITAL_COMMUNITY)
Admission: EM | Admit: 2013-04-06 | Discharge: 2013-04-06 | Disposition: A | Discharge status: Home / Self Care | Payer: Self-pay | Attending: Emergency Medicine | Admitting: Emergency Medicine

## 2013-04-06 ENCOUNTER — Encounter (HOSPITAL_COMMUNITY): Payer: Self-pay | Admitting: Emergency Medicine

## 2013-04-06 ENCOUNTER — Emergency Department (HOSPITAL_COMMUNITY): Payer: Self-pay

## 2013-04-06 DIAGNOSIS — Z79899 Other long term (current) drug therapy: Secondary | ICD-10-CM | POA: Insufficient documentation

## 2013-04-06 DIAGNOSIS — J45909 Unspecified asthma, uncomplicated: Secondary | ICD-10-CM | POA: Insufficient documentation

## 2013-04-06 DIAGNOSIS — F17200 Nicotine dependence, unspecified, uncomplicated: Secondary | ICD-10-CM | POA: Insufficient documentation

## 2013-04-06 DIAGNOSIS — IMO0002 Reserved for concepts with insufficient information to code with codable children: Secondary | ICD-10-CM | POA: Insufficient documentation

## 2013-04-06 DIAGNOSIS — M545 Low back pain, unspecified: Secondary | ICD-10-CM | POA: Insufficient documentation

## 2013-04-06 DIAGNOSIS — S53409A Unspecified sprain of unspecified elbow, initial encounter: Secondary | ICD-10-CM

## 2013-04-06 MED ORDER — DICLOFENAC SODIUM 75 MG PO TBEC: 75.0000 mg | DELAYED_RELEASE_TABLET | Freq: Two times a day (BID) | ORAL | Status: DC

## 2013-04-06 MED ORDER — TRAMADOL HCL 50 MG PO TABS: ORAL_TABLET | ORAL | Status: DC

## 2013-04-06 NOTE — Discharge Instructions (Signed)
Your x-ray is negative for fracture or dislocation. Please use the sling for the next 4 or 5 days. Please use medications as prescribed. Ultram may cause drowsiness, please use with caution. Please see the orthopedist listed above, or return to the emergency department if not improving.
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
## 2013-04-12 NOTE — ED Provider Notes (Signed)
Medical screening examination/treatment/procedure(s) were performed by non-physician practitioner and as supervising physician I was immediately available for consultation/collaboration.   EKG Interpretation None        Suzi RootsKevin E India Jolin, MD 04/12/13 [phone]

## 2014-01-04 ENCOUNTER — Encounter (HOSPITAL_BASED_OUTPATIENT_CLINIC_OR_DEPARTMENT_OTHER): Payer: Self-pay

## 2014-01-04 ENCOUNTER — Emergency Department (HOSPITAL_BASED_OUTPATIENT_CLINIC_OR_DEPARTMENT_OTHER)
Admission: EM | Admit: 2014-01-04 | Discharge: 2014-01-04 | Disposition: A | Discharge status: Home / Self Care | Payer: Medicaid Other | Attending: Emergency Medicine | Admitting: Emergency Medicine

## 2014-01-04 DIAGNOSIS — G8929 Other chronic pain: Secondary | ICD-10-CM | POA: Insufficient documentation

## 2014-01-04 DIAGNOSIS — F17210 Nicotine dependence, cigarettes, uncomplicated: Secondary | ICD-10-CM | POA: Insufficient documentation

## 2014-01-04 DIAGNOSIS — M545 Low back pain: Secondary | ICD-10-CM | POA: Insufficient documentation

## 2014-01-04 DIAGNOSIS — M5432 Sciatica, left side: Secondary | ICD-10-CM | POA: Insufficient documentation

## 2014-01-04 MED ORDER — DIAZEPAM 5 MG TABLET
5.00 mg | ORAL_TABLET | Freq: Four times a day (QID) | ORAL | Status: DC | PRN
Start: 2014-01-04 — End: 2014-06-30

## 2014-01-04 MED ORDER — DIAZEPAM 5 MG TABLET
5.00 mg | ORAL_TABLET | ORAL | Status: AC
Start: 2014-01-04 — End: 2014-01-04
  Administered 2014-01-04: 5 mg via ORAL
  Filled 2014-01-04: qty 1

## 2014-01-04 MED ORDER — PREDNISONE 50 MG TABLET
50.0000 mg | ORAL_TABLET | Freq: Every day | ORAL | Status: AC
Start: 2014-01-04 — End: 2014-01-07

## 2014-01-04 MED ORDER — KETOROLAC 60 MG/2 ML INTRAMUSCULAR SOLUTION
60.00 mg | INTRAMUSCULAR | Status: AC
Start: 2014-01-04 — End: 2014-01-04
  Administered 2014-01-04: 60 mg via INTRAMUSCULAR
  Filled 2014-01-04: qty 2

## 2014-01-04 NOTE — Discharge Instructions (Signed)
Sciatica  Sciatica is pain, weakness, numbness, or tingling along the path of the sciatic nerve. The nerve starts in the lower back and runs down the back of each leg. The nerve controls the muscles in the lower leg and in the back of the knee, while also providing sensation to the back of the thigh, lower leg, and the sole of your foot. Sciatica is a symptom of another medical condition. For instance, nerve damage or certain conditions, such as a herniated disk or bone spur on the spine, pinch or put pressure on the sciatic nerve. This causes the pain, weakness, or other sensations normally associated with sciatica. Generally, sciatica only affects one side of the body.  CAUSES    Herniated or slipped disc.   Degenerative disk disease.   A pain disorder involving the narrow muscle in the buttocks (piriformis syndrome).   Pelvic injury or fracture.   Pregnancy.   Tumor (rare).  SYMPTOMS   Symptoms can vary from mild to very severe. The symptoms usually travel from the low back to the buttocks and down the back of the leg. Symptoms can include:   Mild tingling or dull aches in the lower back, leg, or hip.   Numbness in the back of the calf or sole of the foot.   Burning sensations in the lower back, leg, or hip.   Sharp pains in the lower back, leg, or hip.   Leg weakness.   Severe back pain inhibiting movement.  These symptoms may get worse with coughing, sneezing, laughing, or prolonged sitting or standing. Also, being overweight may worsen symptoms.  DIAGNOSIS   Your caregiver will perform a physical exam to look for common symptoms of sciatica. He or she may ask you to do certain movements or activities that would trigger sciatic nerve pain. Other tests may be performed to find the cause of the sciatica. These may include:   Blood tests.   X-rays.   Imaging tests, such as an MRI or CT scan.  TREATMENT   Treatment is directed at the cause of the sciatic pain. Sometimes, treatment is not necessary  and the pain and discomfort goes away on its own. If treatment is needed, your caregiver may suggest:   Over-the-counter medicines to relieve pain.   Prescription medicines, such as anti-inflammatory medicine, muscle relaxants, or narcotics.   Applying heat or ice to the painful area.   Steroid injections to lessen pain, irritation, and inflammation around the nerve.   Reducing activity during periods of pain.   Exercising and stretching to strengthen your abdomen and improve flexibility of your spine. Your caregiver may suggest losing weight if the extra weight makes the back pain worse.   Physical therapy.   Surgery to eliminate what is pressing or pinching the nerve, such as a bone spur or part of a herniated disk.  HOME CARE INSTRUCTIONS    Only take over-the-counter or prescription medicines for pain or discomfort as directed by your caregiver.   Apply ice to the affected area for 20 minutes, 3-4 times a day for the first 48-72 hours. Then try heat in the same way.   Exercise, stretch, or perform your usual activities if these do not aggravate your pain.   Attend physical therapy sessions as directed by your caregiver.   Keep all follow-up appointments as directed by your caregiver.   Do not wear high heels or shoes that do not provide proper support.   Check your mattress to see if  it is too soft. A firm mattress may lessen your pain and discomfort.  SEEK IMMEDIATE MEDICAL CARE IF:    You lose control of your bowel or bladder (incontinence).   You have increasing weakness in the lower back, pelvis, buttocks, or legs.   You have redness or swelling of your back.   You have a burning sensation when you urinate.   You have pain that gets worse when you lie down or awakens you at night.   Your pain is worse than you have experienced in the past.   Your pain is lasting longer than 4 weeks.   You are suddenly losing weight without reason.  MAKE SURE YOU:   Understand these  instructions.   Will watch your condition.   Will get help right away if you are not doing well or get worse.  Document Released: 01/16/2001 Document Revised: 07/24/2011 Document Reviewed: 06/03/2011  [facility] Patient Information 2015 [employer] [place]. This information is not intended to replace advice given to you by your health care provider. Make sure you discuss any questions you have with your health care provider.  Back Pain, Adult  Back pain is very common in adults.The cause of back pain is rarely dangerous and the pain often gets better over time.The cause of your back pain may not be known. Some common causes of back pain include:   Strain of the muscles or ligaments supporting the spine.   Wear and tear (degeneration) of the spinal disks.   Arthritis.   Direct injury to the back.  For many people, back pain may return. Since back pain is rarely dangerous, most people can learn to manage this condition on their own.  HOME CARE INSTRUCTIONS  Watch your back pain for any changes. The following actions may help to lessen any discomfort you are feeling:   Remain active. It is stressful on your back to sit or stand in one place for long periods of time. Do not sit, drive, or stand in one place for more than 30 minutes at a time. Take short walks on even surfaces as soon as you are able.Try to increase the length of time you walk each day.   Exercise regularly as directed by your health care provider. Exercise helps your back heal faster. It also helps avoid future injury by keeping your muscles strong and flexible.   Do not stay in bed.Resting more than 1-2 days can delay your recovery.   Pay attention to your body when you bend and lift. The most comfortable positions are those that put less stress on your recovering back. Always use proper lifting techniques, including:   Bending your knees.   Keeping the load close to your body.   Avoiding twisting.   Find a comfortable position to sleep.  Use a firm mattress and lie on your side with your knees slightly bent. If you lie on your back, put a pillow under your knees.   Avoid feeling anxious or stressed.Stress increases muscle tension and can worsen back pain.It is important to recognize when you are anxious or stressed and learn ways to manage it, such as with exercise.   Take medicines only as directed by your health care provider. Over-the-counter medicines to reduce pain and inflammation are often the most helpful.Your health care provider may prescribe muscle relaxant drugs.These medicines help dull your pain so you can more quickly return to your normal activities and healthy exercise.   Apply ice to the injured area:  Put ice in a plastic bag.   Place a towel between your skin and the bag.   Leave the ice on for 20 minutes, 2-3 times a day for the first 2-3 days. After that, ice and heat may be alternated to reduce pain and spasms.   Maintain a healthy weight. Excess weight puts extra stress on your back and makes it difficult to maintain good posture.  SEEK MEDICAL CARE IF:   You have pain that is not relieved with rest or medicine.   You have increasing pain going down into the legs or buttocks.   You have pain that does not improve in one week.   You have night pain.   You lose weight.   You have a fever or chills.  SEEK IMMEDIATE MEDICAL CARE IF:    You develop new bowel or bladder control problems.   You have unusual weakness or numbness in your arms or legs.   You develop nausea or vomiting.   You develop abdominal pain.   You feel faint.  Document Released: 01/22/2005 Document Revised: 09/25/2013 Document Reviewed: 05/26/2013  [facility] Patient Information 2015 [place], [place]. This information is not intended to replace advice given to you by your health care provider. Make sure you discuss any questions you have with your health care provider.

## 2014-01-04 NOTE — ED Nurses Note (Signed)
Lower right back pain since this AM. Patient has known sciatica but it is worse now.

## 2014-01-04 NOTE — ED Nurses Note (Signed)
Patient discharged home with family.  AVS reviewed with patient/care giver.  A written copy of the AVS and discharge instructions was given to the patient/care giver.  Questions sufficiently answered as needed.  Patient/care giver encouraged to follow up with PCP as indicated.  In the event of an emergency, patient/care giver instructed to call 911 or go to the nearest emergency room.     Pt d/c with prescription for valium and prednisone. Pt verbalized understanding of medication administration and of need for follow up with PCP. Denies questions. Pt wheeled out of room by family member.

## 2014-01-04 NOTE — ED Provider Notes (Signed)
Stanley Anderson Eichenberger, PA-C  [facility]  Emergency Department Visit Note    Date: 01/04/2014  Primary care provider: None Given  Means of arrival: private car  History obtained by: patient  History limited by: none      Chief Complaint: Back pain    History of Present Illness     Stanley CanterDekelvin Anderson, date of birth [birth year], is a 43 y.o. male who presents to the Emergency Department complaining of back pain.    Context:  Patient states that he has chronic problems with his lower back.  States that he injured it in 2010 when he was lifting weights.  States that he has had problems with the lower back and left leg sciatica since.  States that he was seen at an ER in [place] for the same as well as a PMD and was on percocet sometimes for it.  States that they moved here recently.  States that he had his first patient visit with [facility] last week.  States that he has been doing some working out (sit ups and push ups) the last few days with increasing numbers.  States that today he has pain in the right back and into the right buttock.  States that he wanted to catch it because this is how it normally starts and then it goes down the leg.  Denies abdominal pain.  Denies urinary symptoms.  Denies radiation into abdomen.  Denies loss of control of bowels or bladder.  Denies sensation changes in abdomen, genitals or pelvis.  Denies weakness in one leg over the other.  Denies CP or SOB.  Ranks it as 10/10 and describes it as aching.  Denies DM or IV drug use.  No pre-er treatment or meds.  No other c/o.  Pertinent Past Medical History:  See nursing  Onset:  Today  Timing:  Constant  Location/Radiation:  Right lower back into buttock  Quality:  Aching  Severity:  10/10  Modifying Factors:  As above  Associated Symptoms:   Positive:  As above  Negative:  As above    Review of Systems     The pertinent positive and negative symptoms are as per HPI. All other systems reviewed and are negative.    Patient History      Past Medical  History:  History reviewed. No pertinent past medical history.    Past Surgical History:  History reviewed. No pertinent past surgical history.    Family History:  No family history on file.        Social History:  History   Substance Use Topics    Smoking status: Current Every Day Smoker -- 0.50 packs/day    Smokeless tobacco: Not on file    Alcohol Use: No     History   Drug Use No       Medications:  Previous Medications    No medications on file       Allergies:   No Known Allergies    Physical Exam     Vital Signs:  Filed Vitals:    01/04/14 1228 01/04/14 1300   BP: 124/80    Pulse: 122 98   Temp: 37.1 C (98.8 F)    Resp: 20    SpO2: 100%        The initial visit vital signs are reviewed as above.     Pulse Ox: 100% on None (Room Air); interpreted by me ZO:XWRUEAas:Normal    GENERAL:  This is a well  appearing 43yom patient who is interactive, appropriate and showing no outward signs of distress.  Non-toxic.  Ambulatory.  HEAD:  Atraumatic, normocephalic.  HEENT:  Conjunctival WNL, no gross deformity noted.  Mucous membranes moist.  NECK:  Supple, no meningeal signs.  HEART: RRR @ 98 BPM  LUNGS: CTA  ABDOMEN:  Soft.  Non-tender.  Normal sensation.  BACK:  Pain to palpation diffusely throughout lumbar spine musculature and right SI area without point specific tenderness, step off or deformity present.  Normal SLR.  Patellar DTR's WNL.  Heel and toe rises easily.  Ambulatory without difficulty but with pain.  SKIN:  Warm, good color.  No rashes noted.  NEURO/PSYCH:  Patient is interactive, appropriate and grossly intact.    ED Progress Note/Medical Decision Making       Orders Placed This Encounter    ketorolac (TORADOL) 60mg /2 mL IM injection    diazepam (VALIUM) tablet    diazepam (VALIUM) 5 mg Oral Tablet    predniSONE (DELTASONE) 50 mg Oral Tablet       Patient was given IM Toradol and PO Valium here in ED for treatment of back pain/sciatica.  Patient advised that I do not feel that emergent imaging would be  necessary due to his history/exam and instead recommended f/u with PMD for continued pain as he may need outpatient MRI for continued symptoms.  Patient given prednisone taper and valium to use at home.  To call PMD for f/u appt and to return to the ED for worsening or concerns.  Patient made aware that BP at today's visit was 124/80 and to have recheck with PMD in regards to such.   Related understanding to treatment and plan and was d/c to home in stable condition.   Supervising physician: Dr. Herbie DrapeMongold      Pre-Disposition Vitals:  Filed Vitals:    01/04/14 1228 01/04/14 1300   BP: 124/80    Pulse: 122 98   Temp: 37.1 C (98.8 F)    Resp: 20    SpO2: 100%        Clinical Impression      1. Acute exacerbation of chronic lower back pain with radicular pain    Plan/Disposition     Discharged    Prescriptions:  New Prescriptions    DIAZEPAM (VALIUM) 5 MG ORAL TABLET    Take 1 Tab (5 mg total) by mouth Every 6 hours as needed for Anxiety    PREDNISONE (DELTASONE) 50 MG ORAL TABLET    Take 1 Tab (50 mg total) by mouth Once a day for 3 days       Follow Up:  [facility]  [address]  [place] [id number]  [phone]                Condition on Disposition: Stable

## 2014-04-14 ENCOUNTER — Encounter (HOSPITAL_COMMUNITY): Payer: Self-pay | Admitting: Family Medicine

## 2014-04-14 ENCOUNTER — Emergency Department (HOSPITAL_COMMUNITY)
Admission: EM | Admit: 2014-04-14 | Discharge: 2014-04-14 | Disposition: A | Discharge status: Home / Self Care | Payer: Self-pay | Attending: Emergency Medicine | Admitting: Emergency Medicine

## 2014-04-14 ENCOUNTER — Emergency Department (HOSPITAL_COMMUNITY): Payer: Self-pay

## 2014-04-14 DIAGNOSIS — J159 Unspecified bacterial pneumonia: Secondary | ICD-10-CM | POA: Insufficient documentation

## 2014-04-14 DIAGNOSIS — R000 Tachycardia, unspecified: Secondary | ICD-10-CM | POA: Insufficient documentation

## 2014-04-14 DIAGNOSIS — R05 Cough: Secondary | ICD-10-CM

## 2014-04-14 DIAGNOSIS — Z791 Long term (current) use of non-steroidal anti-inflammatories (NSAID): Secondary | ICD-10-CM | POA: Insufficient documentation

## 2014-04-14 DIAGNOSIS — R1110 Vomiting, unspecified: Secondary | ICD-10-CM

## 2014-04-14 DIAGNOSIS — J45909 Unspecified asthma, uncomplicated: Secondary | ICD-10-CM | POA: Insufficient documentation

## 2014-04-14 DIAGNOSIS — J189 Pneumonia, unspecified organism: Secondary | ICD-10-CM

## 2014-04-14 DIAGNOSIS — Z720 Tobacco use: Secondary | ICD-10-CM | POA: Insufficient documentation

## 2014-04-14 DIAGNOSIS — M545 Low back pain: Secondary | ICD-10-CM | POA: Insufficient documentation

## 2014-04-14 DIAGNOSIS — Z79899 Other long term (current) drug therapy: Secondary | ICD-10-CM | POA: Insufficient documentation

## 2014-04-14 DIAGNOSIS — M5430 Sciatica, unspecified side: Secondary | ICD-10-CM | POA: Insufficient documentation

## 2014-04-14 DIAGNOSIS — M546 Pain in thoracic spine: Secondary | ICD-10-CM | POA: Insufficient documentation

## 2014-04-14 DIAGNOSIS — R1032 Left lower quadrant pain: Secondary | ICD-10-CM | POA: Insufficient documentation

## 2014-04-14 DIAGNOSIS — R197 Diarrhea, unspecified: Secondary | ICD-10-CM

## 2014-04-14 DIAGNOSIS — R059 Cough, unspecified: Secondary | ICD-10-CM

## 2014-04-14 LAB — CBC WITH DIFFERENTIAL/PLATELET
BASOS ABS: 0 10*3/uL (ref 0.0–0.1)
BASOS PCT: 0 % (ref 0–1)
Eosinophils Absolute: 0 10*3/uL (ref 0.0–0.7)
Eosinophils Relative: 0 % (ref 0–5)
HCT: 39.6 % (ref 39.0–52.0)
HEMOGLOBIN: 13.1 g/dL (ref 13.0–17.0)
LYMPHS ABS: 1.4 10*3/uL (ref 0.7–4.0)
Lymphocytes Relative: 11 % — ABNORMAL LOW (ref 12–46)
MCH: 28.9 pg (ref 26.0–34.0)
MCHC: 33.1 g/dL (ref 30.0–36.0)
MCV: 87.4 fL (ref 78.0–100.0)
MONO ABS: 2 10*3/uL — AB (ref 0.1–1.0)
Monocytes Relative: 15 % — ABNORMAL HIGH (ref 3–12)
NEUTROS ABS: 9.7 10*3/uL — AB (ref 1.7–7.7)
Neutrophils Relative %: 74 % (ref 43–77)
PLATELETS: 241 10*3/uL (ref 150–400)
RBC: 4.53 MIL/uL (ref 4.22–5.81)
RDW: 13.1 % (ref 11.5–15.5)
WBC: 13.1 10*3/uL — AB (ref 4.0–10.5)

## 2014-04-14 LAB — COMPREHENSIVE METABOLIC PANEL
ALT: 14 U/L (ref 0–53)
ANION GAP: 7 (ref 5–15)
AST: 22 U/L (ref 0–37)
Albumin: 3.6 g/dL (ref 3.5–5.2)
Alkaline Phosphatase: 54 U/L (ref 39–117)
BUN: 8 mg/dL (ref 6–23)
CALCIUM: 8.9 mg/dL (ref 8.4–10.5)
CHLORIDE: 106 mmol/L (ref 96–112)
CO2: 22 mmol/L (ref 19–32)
CREATININE: 1.03 mg/dL (ref 0.50–1.35)
GFR calc Af Amer: >90 mL/min (ref >90)
GFR calc non Af Amer: 86 mL/min — ABNORMAL LOW (ref >90)
Glucose, Bld: 111 mg/dL — ABNORMAL HIGH (ref 70–99)
Potassium: 3.3 mmol/L — ABNORMAL LOW (ref 3.5–5.1)
Sodium: 135 mmol/L (ref 135–145)
TOTAL PROTEIN: 7.1 g/dL (ref 6.0–8.3)
Total Bilirubin: 0.8 mg/dL (ref 0.3–1.2)

## 2014-04-14 LAB — URINALYSIS, ROUTINE W REFLEX MICROSCOPIC
Bilirubin Urine: NEGATIVE
Glucose, UA: NEGATIVE mg/dL
Hgb urine dipstick: NEGATIVE
Ketones, ur: NEGATIVE mg/dL
Leukocytes, UA: NEGATIVE
Nitrite: NEGATIVE
PH: 8 (ref 5.0–8.0)
Protein, ur: NEGATIVE mg/dL
SPECIFIC GRAVITY, URINE: 1.009 (ref 1.005–1.030)
Urobilinogen, UA: 1 mg/dL (ref 0.0–1.0)

## 2014-04-14 LAB — LIPASE, BLOOD: Lipase: 18 U/L (ref 11–59)

## 2014-04-14 MED ORDER — LEVOFLOXACIN IN D5W 750 MG/150ML IV SOLN
750.0000 mg | Freq: Once | INTRAVENOUS | Status: AC
  Administered 2014-04-14: 750 mg via INTRAVENOUS
  Filled 2014-04-14: qty 150

## 2014-04-14 MED ORDER — IOHEXOL 300 MG/ML  SOLN
100.0000 mL | Freq: Once | INTRAMUSCULAR | Status: AC | PRN
  Administered 2014-04-14: 100 mL via INTRAVENOUS

## 2014-04-14 MED ORDER — MORPHINE SULFATE 4 MG/ML IJ SOLN
4.0000 mg | Freq: Once | INTRAMUSCULAR | Status: AC
  Administered 2014-04-14: 4 mg via INTRAVENOUS
  Filled 2014-04-14: qty 1

## 2014-04-14 MED ORDER — DICYCLOMINE HCL 20 MG PO TABS: 20.0000 mg | ORAL_TABLET | Freq: Two times a day (BID) | ORAL | Status: DC

## 2014-04-14 MED ORDER — IOHEXOL 300 MG/ML  SOLN
50.0000 mL | Freq: Once | INTRAMUSCULAR | Status: AC | PRN
  Administered 2014-04-14: 50 mL via ORAL

## 2014-04-14 MED ORDER — SODIUM CHLORIDE 0.9 % IV BOLUS (SEPSIS)
1000.0000 mL | Freq: Once | INTRAVENOUS | Status: AC
  Administered 2014-04-14: 1000 mL via INTRAVENOUS

## 2014-04-14 MED ORDER — HYDROCODONE-ACETAMINOPHEN 5-325 MG PO TABS: 1.0000 | ORAL_TABLET | ORAL | Status: DC | PRN

## 2014-04-14 MED ORDER — ONDANSETRON 4 MG PO TBDP: ORAL_TABLET | ORAL | Status: DC

## 2014-04-14 MED ORDER — LEVOFLOXACIN 750 MG PO TABS: 750.0000 mg | ORAL_TABLET | Freq: Every day | ORAL | Status: DC

## 2014-04-14 MED ORDER — ONDANSETRON HCL 4 MG/2ML IJ SOLN
4.0000 mg | Freq: Once | INTRAMUSCULAR | Status: AC
  Administered 2014-04-14: 4 mg via INTRAVENOUS
  Filled 2014-04-14: qty 2

## 2014-04-14 NOTE — ED Notes (Signed)
Bed: [id number] Expected date: 04/14/14 Expected time: 1:44 AM Means of arrival: Ambulance Comments: abd pain

## 2014-04-14 NOTE — ED Notes (Signed)
Pt is from home and came in by EMS. Per EMS, pt is complaining of nausea, vomiting, and diarrhea for the last week. Pt has not vomited in the last 24 hours. About 2 hours ago he ate mashed potatoes and pork chops and has been able to keep the food down.

## 2014-04-14 NOTE — ED Notes (Signed)
Patient ambulated to restroom and back to stretcher. He tolerated it well. Provided patient ginger ale.

## 2014-04-14 NOTE — ED Provider Notes (Signed)
CSN: [id number]     Arrival date & time 04/14/14  0157 History   First MD Initiated Contact with Patient 04/14/14 0222     Chief Complaint  Patient presents with  . Abdominal Pain  . Nausea  . Emesis  . Diarrhea     (Consider location/radiation/quality/duration/timing/severity/associated sxs/prior Treatment) HPI Patient presents with one week of lower abdominal pain worse on the left. He also complains of multiple episodes of vomiting daily as well as loose stool. Denies blood in either.  C/o fever and chills. No known sick contacts. No recent travel. States he is having generalized fatigue.  Past Medical History  Diagnosis Date  . Asthma   . Sciatica   . Back pain    History reviewed. No pertinent past surgical history. History reviewed. No pertinent family history. History  Substance Use Topics  . Smoking status: Current Every Day Smoker -- 0.25 packs/day    Types: Cigarettes  . Smokeless tobacco: Not on file  . Alcohol Use: Yes     Comment: 1-2 times a month    Review of Systems  Constitutional: Positive for fever, chills and fatigue.  Respiratory: Positive for cough. Negative for shortness of breath.   Cardiovascular: Negative for chest pain.  Gastrointestinal: Positive for nausea, vomiting, abdominal pain and diarrhea. Negative for blood in stool.  Genitourinary: Negative for dysuria and hematuria.  Musculoskeletal: Negative for myalgias, back pain, neck pain and neck stiffness.  Skin: Negative for rash and wound.  Neurological: Positive for weakness (generalized). Negative for dizziness, light-headedness and numbness.  All other systems reviewed and are negative.     Allergies  Food  Home Medications   Prior to Admission medications   Medication Sig Start Date End Date Taking? Authorizing Provider  aspirin-acetaminophen-caffeine (EXCEDRIN MIGRAINE) [phone] MG per tablet Take 2 tablets by mouth every 6 (six) hours as needed for headache.   Yes Historical  Provider, MD  Doxylamine-DM & DM 6.25-15 & 15 MG/15ML LQPK Take 30 mLs by mouth every 6 (six) hours as needed (for cough/cold).   Yes Historical Provider, MD  Multiple Vitamin (ONE-A-DAY MENS PO) Take 1 tablet by mouth daily.   Yes Historical Provider, MD  diclofenac (VOLTAREN) 75 MG EC tablet Take 1 tablet (75 mg total) by mouth 2 (two) times daily. Patient not taking: Reported on 04/14/2014 04/06/13   Ivery QualeHobson Bryant, PA-C  dicyclomine (BENTYL) 20 MG tablet Take 1 tablet (20 mg total) by mouth 2 (two) times daily. 04/14/14   Loren Raceravid Neeya Prigmore, MD  HYDROcodone-acetaminophen (NORCO) 5-325 MG per tablet Take 1-2 tablets by mouth every 4 (four) hours as needed for severe pain. 04/14/14   Loren Raceravid Iya Hamed, MD  levofloxacin (LEVAQUIN) 750 MG tablet Take 1 tablet (750 mg total) by mouth daily. X 7 days 04/14/14   Loren Raceravid Hezekiah Veltre, MD  ondansetron (ZOFRAN ODT) 4 MG disintegrating tablet 4mg  ODT q4 hours prn nausea/vomit 04/14/14   Loren Raceravid Shondell Fabel, MD  traMADol Janean Sark(ULTRAM) 50 MG tablet 1 or 2 po q6h prn pain Patient not taking: Reported on 04/14/2014 04/06/13   Ivery QualeHobson Bryant, PA-C   BP 118/64 mmHg  Pulse 98  Temp(Src) 99.5 F (37.5 C) (Oral)  Resp 18  Ht 5\' 9"  (1.753 m)  Wt 140 lb (63.504 kg)  BMI 20.67 kg/m2  SpO2 100% Physical Exam  Constitutional: He is oriented to person, place, and time. He appears well-developed and well-nourished. No distress.  HENT:  Head: Normocephalic and atraumatic.  Mouth/Throat: Oropharynx is clear and moist. No oropharyngeal exudate.  Eyes: EOM are normal. Pupils are equal, round, and reactive to light.  Neck: Normal range of motion. Neck supple.  No meningismus  Cardiovascular: Regular rhythm.   Tachycardia  Pulmonary/Chest: Effort normal and breath sounds normal. No respiratory distress. He has no wheezes. He has no rales. He exhibits no tenderness.  Abdominal: Soft. Bowel sounds are normal. He exhibits no mass. There is tenderness (tenderness to palpation in the left lower quadrant  with voluntary guarding). There is guarding. There is no rebound.  Musculoskeletal: Normal range of motion. He exhibits no edema or tenderness.  The midline thoracic or lumbar tenderness  Neurological: He is alert and oriented to person, place, and time.  5/5 motor in all extremities. Sensation is fully intact.  Skin: Skin is warm and dry. No rash noted. No erythema.  Psychiatric: He has a normal mood and affect. His behavior is normal.  Nursing note and vitals reviewed.   ED Course  Procedures (including critical care time) Labs Review Labs Reviewed  CBC WITH DIFFERENTIAL/PLATELET - Abnormal; Notable for the following:    WBC 13.1 (*)    Neutro Abs 9.7 (*)    Lymphocytes Relative 11 (*)    Monocytes Relative 15 (*)    Monocytes Absolute 2.0 (*)    All other components within normal limits  COMPREHENSIVE METABOLIC PANEL - Abnormal; Notable for the following:    Potassium 3.3 (*)    Glucose, Bld 111 (*)    GFR calc non Af Amer 86 (*)    All other components within normal limits  LIPASE, BLOOD  URINALYSIS, ROUTINE W REFLEX MICROSCOPIC    Imaging Review Dg Chest 2 View  04/14/2014   CLINICAL DATA:  Nonproductive cough for 1 week. Chest pain, shortness of breath, fever for 1 day.  EXAM: CHEST  2 VIEW  COMPARISON:  08/17/2010  FINDINGS: Mild hyperinflation. The cardiomediastinal contours are normal. The lungs are clear. Pulmonary vasculature is normal. No consolidation, pleural effusion, or pneumothorax. No acute osseous abnormalities are seen.  IMPRESSION: Stable mild hyperinflation.  No localizing pulmonary process.   Electronically Signed   By: Rubye Oaks M.D.   On: 04/14/2014 03:17   Ct Abdomen Pelvis W Contrast  04/14/2014   CLINICAL DATA:  Left lower quadrant and left buttock pain. Nausea, vomiting, diarrhea and chills.  EXAM: CT ABDOMEN AND PELVIS WITH CONTRAST  TECHNIQUE: Multidetector CT imaging of the abdomen and pelvis was performed using the standard protocol following  bolus administration of intravenous contrast.  CONTRAST:  OMNIPAQUE IOHEXOL 300 MG/ML  SOLN  COMPARISON:  None.  FINDINGS: Patchy airspace opacity in both lower lobes, left greater than right, no pleural effusion.  There are no focal hepatic lesions. The gallbladder is physiologically distended. No pericholecystic inflammatory change. There is mild central intrahepatic biliary ductal dilatation, left greater than right. Common bile duct appears prominent at the porta hepatis measuring 8 mm. The spleen, pancreas, and adrenal glands are normal. Kidneys demonstrate symmetric enhancement and excretion without hydronephrosis or focal renal abnormality.  The stomach is physiologically distended. There are no dilated or thickened small bowel loops. Small volume of stool throughout the colon. No evidence of perirectal fluid collection. The appendix is air-filled and normal. No free air, free fluid, or intra-abdominal fluid collection.  The abdominal aorta is normal in caliber. No retroperitoneal adenopathy.  Within the pelvis the bladder is physiologically distended. Prostate gland is normal in size. No pelvic free fluid. Small pelvic phleboliths noted.  There are no acute  or suspicious osseous abnormalities. Bone island is seen in the right proximal femur.  IMPRESSION: 1. Patchy airspace opacity in both lower lobes in the included lung bases, concerning for pneumonia. 2. Mild biliary prominence, a common bowel duct measures 8 mm, mild central intrahepatic biliary dilatation. This may be a spurious finding, however further evaluation could be considered with MRCP if patient is able tolerate breath hold technique.   Electronically Signed   By: Rubye Oaks M.D.   On: 04/14/2014 04:50     EKG Interpretation None      MDM   Final diagnoses:  Cough  Community acquired pneumonia  Vomiting and diarrhea    Patient with no further vomiting in the emergency department. Is drinking well. Ambulating without  difficulty states his pain is improved. Saturations in the high 90s. CT with mild biliary dilatation this does not correspond with the patient's area of pain. Believe he is stable for discharge. Will start on by mouth antibiotics and antiemetics. Return precautions given.    Loren Racer, MD 04/14/14 [phone]

## 2014-04-14 NOTE — Discharge Instructions (Signed)
If your symptoms persist, call and make an appointment to follow-up with gastroenterologist.  Nausea and Vomiting Nausea is a sick feeling that often comes before throwing up (vomiting). Vomiting is a reflex where stomach contents come out of your mouth. Vomiting can cause severe loss of body fluids (dehydration). Children and elderly adults can become dehydrated quickly, especially if they also have diarrhea. Nausea and vomiting are symptoms of a condition or disease. It is important to find the cause of your symptoms. CAUSES   Direct irritation of the stomach lining. This irritation can result from increased acid production (gastroesophageal reflux disease), infection, food poisoning, taking certain medicines (such as nonsteroidal anti-inflammatory drugs), alcohol use, or tobacco use.  Signals from the brain.These signals could be caused by a headache, heat exposure, an inner ear disturbance, increased pressure in the brain from injury, infection, a tumor, or a concussion, pain, emotional stimulus, or metabolic problems.  An obstruction in the gastrointestinal tract (bowel obstruction).  Illnesses such as diabetes, hepatitis, gallbladder problems, appendicitis, kidney problems, cancer, sepsis, atypical symptoms of a heart attack, or eating disorders.  Medical treatments such as chemotherapy and radiation.  Receiving medicine that makes you sleep (general anesthetic) during surgery. DIAGNOSIS Your caregiver may ask for tests to be done if the problems do not improve after a few days. Tests may also be done if symptoms are severe or if the reason for the nausea and vomiting is not clear. Tests may include:  Urine tests.  Blood tests.  Stool tests.  Cultures (to look for evidence of infection).  X-rays or other imaging studies. Test results can help your caregiver make decisions about treatment or the need for additional tests. TREATMENT You need to stay well hydrated. Drink frequently  but in small amounts.You may wish to drink water, sports drinks, clear broth, or eat frozen ice pops or gelatin dessert to help stay hydrated.When you eat, eating slowly may help prevent nausea.There are also some antinausea medicines that may help prevent nausea. HOME CARE INSTRUCTIONS   Take all medicine as directed by your caregiver.  If you do not have an appetite, do not force yourself to eat. However, you must continue to drink fluids.  If you have an appetite, eat a normal diet unless your caregiver tells you differently.  Eat a variety of complex carbohydrates (rice, wheat, potatoes, bread), lean meats, yogurt, fruits, and vegetables.  Avoid high-fat foods because they are more difficult to digest.  Drink enough water and fluids to keep your urine clear or pale yellow.  If you are dehydrated, ask your caregiver for specific rehydration instructions. Signs of dehydration may include:  Severe thirst.  Dry lips and mouth.  Dizziness.  Dark urine.  Decreasing urine frequency and amount.  Confusion.  Rapid breathing or pulse. SEEK IMMEDIATE MEDICAL CARE IF:   You have blood or brown flecks (like coffee grounds) in your vomit.  You have black or bloody stools.  You have a severe headache or stiff neck.  You are confused.  You have severe abdominal pain.  You have chest pain or trouble breathing.  You do not urinate at least once every 8 hours.  You develop cold or clammy skin.  You continue to vomit for longer than 24 to 48 hours.  You have a fever. MAKE SURE YOU:   Understand these instructions.  Will watch your condition.  Will get help right away if you are not doing well or get worse. Document Released: 01/22/2005 Document Revised: 04/16/2011  Document Reviewed: 06/21/2010 [facility] Patient Information 2015 [employer] [place]. This information is not intended to replace advice given to you by your health care provider. Make sure you discuss any questions  you have with your health care provider.  Pneumonia Pneumonia is an infection of the lungs.  CAUSES Pneumonia may be caused by bacteria or a virus. Usually, these infections are caused by breathing infectious particles into the lungs (respiratory tract). SIGNS AND SYMPTOMS   Cough.  Fever.  Chest pain.  Increased rate of breathing.  Wheezing.  Mucus production. DIAGNOSIS  If you have the common symptoms of pneumonia, your health care provider will typically confirm the diagnosis with a chest X-ray. The X-ray will show an abnormality in the lung (pulmonary infiltrate) if you have pneumonia. Other tests of your blood, urine, or sputum may be done to find the specific cause of your pneumonia. Your health care provider may also do tests (blood gases or pulse oximetry) to see how well your lungs are working. TREATMENT  Some forms of pneumonia may be spread to other people when you cough or sneeze. You may be asked to wear a mask before and during your exam. Pneumonia that is caused by bacteria is treated with antibiotic medicine. Pneumonia that is caused by the influenza virus may be treated with an antiviral medicine. Most other viral infections must run their course. These infections will not respond to antibiotics.  HOME CARE INSTRUCTIONS   Cough suppressants may be used if you are losing too much rest. However, coughing protects you by clearing your lungs. You should avoid using cough suppressants if you can.  Your health care provider may have prescribed medicine if he or she thinks your pneumonia is caused by bacteria or influenza. Finish your medicine even if you start to feel better.  Your health care provider may also prescribe an expectorant. This loosens the mucus to be coughed up.  Take medicines only as directed by your health care provider.  Do not smoke. Smoking is a common cause of bronchitis and can contribute to pneumonia. If you are a smoker and continue to smoke, your  cough may last several weeks after your pneumonia has cleared.  A cold steam vaporizer or humidifier in your room or home may help loosen mucus.  Coughing is often worse at night. Sleeping in a semi-upright position in a recliner or using a couple pillows under your head will help with this.  Get rest as you feel it is needed. Your body will usually let you know when you need to rest. PREVENTION A pneumococcal shot (vaccine) is available to prevent a common bacterial cause of pneumonia. This is usually suggested for:  People over 46 years old.  Patients on chemotherapy.  People with chronic lung problems, such as bronchitis or emphysema.  People with immune system problems. If you are over 65 or have a high risk condition, you may receive the pneumococcal vaccine if you have not received it before. In some countries, a routine influenza vaccine is also recommended. This vaccine can help prevent some cases of pneumonia.You may be offered the influenza vaccine as part of your care. If you smoke, it is time to quit. You may receive instructions on how to stop smoking. Your health care provider can provide medicines and counseling to help you quit. SEEK MEDICAL CARE IF: You have a fever. SEEK IMMEDIATE MEDICAL CARE IF:   Your illness becomes worse. This is especially true if you are elderly or weakened  from any other disease.  You cannot control your cough with suppressants and are losing sleep.  You begin coughing up blood.  You develop pain which is getting worse or is uncontrolled with medicines.  Any of the symptoms which initially brought you in for treatment are getting worse rather than better.  You develop shortness of breath or chest pain. MAKE SURE YOU:   Understand these instructions.  Will watch your condition.  Will get help right away if you are not doing well or get worse. Document Released: 01/22/2005 Document Revised: 06/08/2013 Document Reviewed:  04/13/2010 [facility] Patient Information 2015 [place], [place]. This information is not intended to replace advice given to you by your health care provider. Make sure you discuss any questions you have with your health care provider.

## 2014-04-14 NOTE — ED Notes (Signed)
Pt is complaining of left lower quad pain, headache, and left buttock. Pt reports he has chills/sweating for the "last few night". Pt states "it just hit me after I eat".

## 2014-06-30 ENCOUNTER — Encounter (HOSPITAL_BASED_OUTPATIENT_CLINIC_OR_DEPARTMENT_OTHER): Payer: Self-pay

## 2014-06-30 ENCOUNTER — Emergency Department (HOSPITAL_BASED_OUTPATIENT_CLINIC_OR_DEPARTMENT_OTHER): Payer: Self-pay

## 2014-06-30 ENCOUNTER — Emergency Department (HOSPITAL_BASED_OUTPATIENT_CLINIC_OR_DEPARTMENT_OTHER)
Admission: EM | Admit: 2014-06-30 | Discharge: 2014-06-30 | Disposition: A | Discharge status: Home / Self Care | Payer: Self-pay | Attending: Emergency Medicine | Admitting: Emergency Medicine

## 2014-06-30 DIAGNOSIS — S0101XA Laceration without foreign body of scalp, initial encounter: Secondary | ICD-10-CM | POA: Insufficient documentation

## 2014-06-30 DIAGNOSIS — F17210 Nicotine dependence, cigarettes, uncomplicated: Secondary | ICD-10-CM | POA: Insufficient documentation

## 2014-06-30 DIAGNOSIS — S0990XA Unspecified injury of head, initial encounter: Secondary | ICD-10-CM | POA: Insufficient documentation

## 2014-06-30 DIAGNOSIS — J45909 Unspecified asthma, uncomplicated: Secondary | ICD-10-CM | POA: Insufficient documentation

## 2014-06-30 HISTORY — DX: Unspecified asthma, uncomplicated: J45.909

## 2014-06-30 MED ORDER — DIPHTH,PERTUSSIS(ACELL),TETANUS 2.5 LF UNIT-8 MCG-5 LF/0.5 ML IM SUSP
0.5000 mL | INTRAMUSCULAR | Status: AC
Start: 2014-06-30 — End: 2014-06-30
  Administered 2014-06-30: 0.5 mL via INTRAMUSCULAR
  Filled 2014-06-30: qty 0.5

## 2014-06-30 MED ORDER — LIDOCAINE 1 %-EPINEPHRINE 1:100,000 INJECTION SOLUTION
INTRAMUSCULAR | Status: DC
Start: 2014-06-30 — End: 2014-06-30
  Filled 2014-06-30: qty 20

## 2014-06-30 NOTE — ED Nurses Note (Signed)
Pt asking to speak to "the doctor." K. Garneau in to speak to pt. Pt asking "what's up?" K. Garneau asked pt what he needed to see him for.  Pt states "Well you are the doctor, you need to tell me what's up, I never got nothing for pain, no instructions, nothing!" This nurse in to remind pt of instructions that were just discussed w/ him by this nurse.  Pt looked at this nurse and stated "I ain't talking to you bitch, I am talking to him." "I said I am not talking to you!"  Pt then turned back to K. Garneau and asked "what the hell do you mean Tylenol and some other shit for pain?" "My fucking head is killing me and that is all you are going to give me."  K. Garneau explained to pt that narcotics are not given when pt has head injury.  Pt stated "I got dough in my wallet!"  Informed pt that it doesn't make a difference that he has money, informed pt that narcotics aren't given for head injuries.  Pt then yells that he didn't ask for narcotics.  Difficult to understand what pt wants at this time.  Pt talking in circles, yelling at Fae PippinK. Garneau and this nurse.  Fae PippinK. Garneau informed pt he is discharged and has been given his instructions.  Pt continued to swear and talk after staff left room.  Pt then walked out of room and back in again.  This nurse in to give pt directions to waiting room.  Pt calmly thanked this nurse then turned around and walked out of room to waiting room.  Security informed of pt.
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
## 2014-06-30 NOTE — Discharge Instructions (Signed)
1. Sutures out in 7-10 days
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
## 2014-07-01 ENCOUNTER — Emergency Department (HOSPITAL_BASED_OUTPATIENT_CLINIC_OR_DEPARTMENT_OTHER)
Admission: EM | Admit: 2014-07-01 | Discharge: 2014-07-01 | Disposition: A | Discharge status: Home / Self Care | Payer: Self-pay | Attending: Emergency Medicine | Admitting: Emergency Medicine

## 2014-07-01 ENCOUNTER — Emergency Department (HOSPITAL_BASED_OUTPATIENT_CLINIC_OR_DEPARTMENT_OTHER): Payer: Self-pay

## 2014-07-01 ENCOUNTER — Encounter (HOSPITAL_BASED_OUTPATIENT_CLINIC_OR_DEPARTMENT_OTHER): Payer: Self-pay

## 2014-07-01 DIAGNOSIS — F0781 Postconcussional syndrome: Secondary | ICD-10-CM | POA: Insufficient documentation

## 2014-07-01 DIAGNOSIS — F17210 Nicotine dependence, cigarettes, uncomplicated: Secondary | ICD-10-CM | POA: Insufficient documentation

## 2014-07-01 DIAGNOSIS — J45909 Unspecified asthma, uncomplicated: Secondary | ICD-10-CM | POA: Insufficient documentation

## 2014-07-01 MED ORDER — HYDROCODONE 5 MG-ACETAMINOPHEN 325 MG TABLET
1.00 | ORAL_TABLET | ORAL | Status: DC | PRN
Start: 2014-07-01 — End: 2015-07-07

## 2014-07-01 MED ORDER — PROMETHAZINE 25 MG/ML INJECTION SOLUTION
50.00 mg | INTRAMUSCULAR | Status: AC
Start: 2014-07-01 — End: 2014-07-01
  Administered 2014-07-01: 50 mg via INTRAMUSCULAR
  Filled 2014-07-01: qty 2

## 2014-07-01 MED ORDER — PROMETHAZINE 25 MG TABLET
25.00 mg | ORAL_TABLET | Freq: Four times a day (QID) | ORAL | Status: DC | PRN
Start: 2014-07-01 — End: 2015-07-07
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
## 2014-07-01 NOTE — ED Nurses Note (Signed)
Patient discharged home with family.  AVS reviewed with patient/care giver.  A written copy of the AVS and discharge instructions was given to the patient/care giver.  Questions sufficiently answered as needed.  Patient/care giver encouraged to follow up with PCP as indicated.  In the event of an emergency, patient/care giver instructed to call 911 or go to the nearest emergency room.     New Prescriptions    HYDROCODONE-ACETAMINOPHEN (NORCO) 5-325 MG ORAL TABLET    Take 1-2 Tabs by mouth Every 4 hours as needed for Pain    PROMETHAZINE (PHENERGAN) 25 MG ORAL TABLET    Take 1-2 Tabs (25-50 mg total) by mouth Every 6 hours as needed for nausea/vomiting

## 2015-07-07 ENCOUNTER — Encounter (HOSPITAL_BASED_OUTPATIENT_CLINIC_OR_DEPARTMENT_OTHER): Payer: Self-pay

## 2015-07-07 ENCOUNTER — Emergency Department (HOSPITAL_BASED_OUTPATIENT_CLINIC_OR_DEPARTMENT_OTHER)
Admission: EM | Admit: 2015-07-07 | Discharge: 2015-07-08 | Disposition: A | Discharge status: Home / Self Care | Payer: Self-pay | Attending: Emergency Medicine | Admitting: Emergency Medicine

## 2015-07-07 DIAGNOSIS — E860 Dehydration: Secondary | ICD-10-CM | POA: Insufficient documentation

## 2015-07-07 DIAGNOSIS — F17210 Nicotine dependence, cigarettes, uncomplicated: Secondary | ICD-10-CM | POA: Insufficient documentation

## 2015-07-07 DIAGNOSIS — K529 Noninfective gastroenteritis and colitis, unspecified: Secondary | ICD-10-CM | POA: Insufficient documentation

## 2015-07-07 NOTE — ED Triage Notes (Signed)
Pt complaining of abdominal pain for the past 24 hours. Pt also experiencing nausea and vomiting.

## 2015-07-08 LAB — CBC WITH DIFF
BASOPHIL #: 0 x10ˆ3/uL (ref 0.00–0.10)
BASOPHIL %: 1 % (ref 0–3)
EOSINOPHIL #: 0.1 x10ˆ3/uL (ref 0.00–0.50)
EOSINOPHIL %: 1 % (ref 0–5)
HCT: 43.3 % (ref 40.0–50.0)
HGB: 14.5 g/dL (ref 13.5–18.0)
LYMPHOCYTE #: 2.9 x10ˆ3/uL (ref 1.00–4.80)
LYMPHOCYTE %: 35 % (ref 15–43)
MCH: 28.6 pg (ref 27.5–33.2)
MCHC: 33.4 g/dL (ref 32.0–36.0)
MCV: 85.5 fL (ref 82.0–97.0)
MONOCYTE #: 1.1 x10ˆ3/uL — ABNORMAL HIGH (ref 0.20–0.90)
MONOCYTE %: 14 % — ABNORMAL HIGH (ref 5–12)
MPV: 8.2 fL (ref 7.4–10.5)
NEUTROPHIL #: 4.1 x10ˆ3/uL (ref 1.50–6.50)
NEUTROPHIL %: 49 % (ref 43–76)
PLATELETS: 214 x10ˆ3/uL (ref 150–450)
RBC: 5.06 x10ˆ6/uL (ref 4.40–5.80)
RDW: 12.9 % (ref 11.0–16.0)
WBC: 8.2 x10ˆ3/uL (ref 4.0–11.0)

## 2015-07-08 LAB — COMPREHENSIVE METABOLIC PROFILE - BMC/JMC ONLY
ALBUMIN/GLOBULIN RATIO: 1.5 (ref 0.8–2.0)
ALBUMIN: 4 g/dL (ref 3.5–5.0)
ALKALINE PHOSPHATASE: 44 U/L (ref 38–126)
ALT (SGPT): 16 U/L — ABNORMAL LOW (ref 17–63)
ANION GAP: 6 mmol/L (ref 3–11)
AST (SGOT): 19 U/L (ref 15–41)
BILIRUBIN TOTAL: 0.5 mg/dL (ref 0.3–1.2)
BUN/CREA RATIO: 11 (ref 6–22)
BUN: 13 mg/dL (ref 6–20)
CALCIUM: 9.3 mg/dL (ref 8.6–10.3)
CHLORIDE: 105 mmol/L (ref 101–111)
CO2 TOTAL: 27 mmol/L (ref 22–32)
CREATININE: 1.16 mg/dL (ref 0.61–1.24)
ESTIMATED GFR: >60 mL/min/1.73mˆ2 (ref >60)
GLUCOSE: 94 mg/dL (ref 70–110)
POTASSIUM: 3.5 mmol/L (ref 3.4–5.1)
PROTEIN TOTAL: 6.7 g/dL (ref 6.4–8.3)
SODIUM: 138 mmol/L (ref 136–145)

## 2015-07-08 LAB — URINALYSIS WITH MICROSCOPIC REFLEX IF INDICATED BMC/JMC ONLY
BILIRUBIN: NEGATIVE mg/dL
BLOOD: NEGATIVE mg/dL
KETONES: 5 mg/dL — AB
LEUKOCYTES: NEGATIVE WBCs/uL
NITRITE: NEGATIVE
PH: 5 (ref <8.0)
SPECIFIC GRAVITY: 1.028 — ABNORMAL HIGH (ref <1.022)
UROBILINOGEN: 4 mg/dL — AB (ref <=2.0)

## 2015-07-08 LAB — BLUE TOP TUBE

## 2015-07-08 LAB — LIPASE
LIPASE: 22 U/L (ref 22–51)
LIPASE: 22 U/L (ref 22–51)

## 2015-07-08 LAB — GREEN TUBE

## 2015-07-08 LAB — RED TOP TUBE

## 2015-07-08 MED ORDER — KETOROLAC 30 MG/ML (1 ML) INJECTION SOLUTION
30.00 mg | INTRAMUSCULAR | Status: AC
Start: 2015-07-08 — End: 2015-07-08
  Administered 2015-07-08: 30 mg via INTRAVENOUS
  Filled 2015-07-08: qty 1

## 2015-07-08 MED ORDER — PROMETHAZINE 25 MG TABLET
25.00 mg | ORAL_TABLET | Freq: Four times a day (QID) | ORAL | 0 refills | Status: DC | PRN
Start: 2015-07-08 — End: 2015-11-15

## 2015-07-08 MED ORDER — SODIUM CHLORIDE 0.9 % IV BOLUS
1000.00 mL | INJECTION | Status: AC
Start: 2015-07-08 — End: 2015-07-08
  Administered 2015-07-08: 1000 mL via INTRAVENOUS
  Administered 2015-07-08: 0 mL via INTRAVENOUS

## 2015-07-08 MED ORDER — SODIUM CHLORIDE 0.9 % (FLUSH) INJECTION SYRINGE
10.00 mL | INJECTION | Freq: Three times a day (TID) | INTRAMUSCULAR | Status: DC
Start: 2015-07-08 — End: 2015-07-08

## 2015-07-08 MED ORDER — PROMETHAZINE 12.5MG IN NS 50ML PREMIX
12.50 mg | INJECTION | INTRAVENOUS | Status: AC
Start: 2015-07-08 — End: 2015-07-08
  Administered 2015-07-08: 12.5 mg via INTRAVENOUS
  Filled 2015-07-08: qty 50

## 2015-07-08 NOTE — ED Nurses Note (Signed)
Pt states ate [employer] last night and later started vomiting.  Pt states that his stomach is sore.

## 2015-07-08 NOTE — ED Nurses Note (Signed)
Current Discharge Medication List      START taking these medications.       Details    promethazine 25 mg Tablet   Commonly known as:  PHENERGAN    25 mg, Oral, Q6H PRN   Qty:  12 Tab   Refills:  0         Patient discharged home with family.  AVS reviewed with patient/care giver.  A written copy of the AVS and discharge instructions was given to the patient/care giver.  Questions sufficiently answered as needed.  Patient/care giver encouraged to follow up with PCP as indicated.  In the event of an emergency, patient/care giver instructed to call 911 or go to the nearest emergency room.   Pt in NAD and has equal and unlabored respirations.  Pt ambulated to lobby by self with S/O pt instructed to not drink alcohol or drive a vehicle while taking phenergan pt verbalized understanding.  Pt received work note to return to work on 07/09/15

## 2015-07-08 NOTE — ED Provider Notes (Signed)
Kara PacerLondner, Acacia Latorre S, MD  [facility]  Emergency Department Visit Note    Date:  07/08/2015  Primary care provider:  None Given  Means of arrival:  private car  History obtained from: patient  History limited by: none    Chief Complaint: Abdominal pain     HISTORY OF PRESENT ILLNESS     Stanley Anderson, date of birth May 28, 1970, is a 45 y.o. male who presents to the Emergency Department complaining of diffuse abdominal pain, nausea, vomiting, and diarrhea that started two nights ago.     Patient states that the symptoms [facility] He has not been able to tolerate anything PO for the last 24 hours. Patient does not report any pertinent medical history. He rates the severity of his abdominal pain a 7 out of 10. No blood in stool, hematemesis, chest pain, or shortness of breath.     REVIEW OF SYSTEMS     The pertinent positive and negative symptoms are as per HPI. All other systems reviewed and are negative.     PATIENT HISTORY     Past Medical History:  Past Medical History:   Diagnosis Date    Asthma      Past Surgical History:  History reviewed. No pertinent surgical history.    Family History:  No history of acute family illness given at this time.     Social History:  Social History   Substance Use Topics    Smoking status: Current Every Day Smoker     Packs/day: 0.50    Smokeless tobacco: None    Alcohol use Yes      Comment: occ     History   Drug Use No     Medications:  No outpatient prescriptions have been marked as taking for the 07/07/15 encounter [facility](Hospital Encounter).     Allergies:  No Known Allergies    PHYSICAL EXAM     Vitals:   07/07/15 2356   BP: 130/76   Pulse: 89   Resp: 18   Temp: 36.8 C (98.2 F)   SpO2: 99%     Pulse ox  99% on None (Room Air) interpreted by me as: Normal    Constitutional: The patient is alert and oriented to person, place, and time. Well-developed and well-nourished. Soft spoken.   HENT: Atraumatic, normocephalic head. Mucous membranes slightly  dry. TM's clear, Nares unremarkable. Oropharynx shows no erythema or exudate.   Eyes: Pupils equal and round, reactive to light. No scleral icterus. Normal conjunctiva. Extraocular movements are intact.  Neck: Supple, non-tender, no nuchal rigidity, no adenopathy.   Lungs: Clear to auscultation bilaterally. Symmetric and equal expansion. No respiratory distress or retractions.  Cardiovascular: Heart is S1-S2 regular rate and regular rhythm without murmur click or rub.  Abdomen:  Soft, non-distended. Diffuse abdominal tenderness to palpation without evidence of rebound or guarding. No pulsatile masses. No organomegaly.   Genitourinary: No CVA tenderness.  Extremities: Full range of motion, no clubbing, cyanosis, or edema. Pulses 2+, capillary refill <2 seconds.  Spine: No midline or paraspinal muscle tenderness to palpation. No step-off.   Skin: Warm and dry. No cyanosis, jaundice, rash or lesion.  Neurologic: Alert and oriented x3. Normal facial symmetry and speech, Normal upper and lower extremity strength, and grossly normal sensation.     DIFFERENTIAL DIAGNOSES     1. Gastroenteritis   2. Food poisoning   3. Dehydration   4. Electrolyte abnormality    DIAGNOSTIC STUDIES  Labs:    Results for orders placed or performed during the hospital encounter of 07/07/15   COMPREHENSIVE METABOLIC PROFILE - [facility] ONLY   Result Value Ref Range    SODIUM 138 136 - 145 mmol/L    POTASSIUM 3.5 3.4 - 5.1 mmol/L    CHLORIDE 105 101 - 111 mmol/L    CO2 TOTAL 27 22 - 32 mmol/L    ANION GAP 6 3 - 11 mmol/L    BUN 13 6 - 20 mg/dL    CREATININE 1.61 0.96 - 1.24 mg/dL    BUN/CREA RATIO 11 6 - 22    ESTIMATED GFR >60 >60 mL/min/1.62m2    ALBUMIN 4.0 3.5 - 5.0 g/dL    CALCIUM 9.3 8.6 - 04.5 mg/dL    GLUCOSE 94 70 - 409 mg/dL    ALKALINE PHOSPHATASE 44 38 - 126 U/L    ALT (SGPT) 16 (L) 17 - 63 U/L    AST (SGOT) 19 15 - 41 U/L    BILIRUBIN TOTAL 0.5 0.3 - 1.2 mg/dL    PROTEIN TOTAL 6.7 6.4 - 8.3 g/dL    ALBUMIN/GLOBULIN RATIO 1.5 0.8 -  2.0   LIPASE   Result Value Ref Range    LIPASE 22 22 - 51 U/L   CBC WITH DIFF   Result Value Ref Range    WBC 8.2 4.0 - 11.0 x103/uL    RBC 5.06 4.40 - 5.80 x106/uL    HGB 14.5 13.5 - 18.0 g/dL    HCT 81.1 91.4 - 78.2 %    MCV 85.5 82.0 - 97.0 fL    MCH 28.6 27.5 - 33.2 pg    MCHC 33.4 32.0 - 36.0 g/dL    RDW 95.6 21.3 - 08.6 %    PLATELETS 214 150 - 450 x103/uL    MPV 8.2 7.4 - 10.5 fL    NEUTROPHIL % 49 43 - 76 %    LYMPHOCYTE % 35 15 - 43 %    MONOCYTE % 14 (H) 5 - 12 %    EOSINOPHIL % 1 0 - 5 %    BASOPHIL % 1 0 - 3 %    NEUTROPHIL # 4.10 1.50 - 6.50 x103/uL    LYMPHOCYTE # 2.90 1.00 - 4.80 x103/uL    MONOCYTE # 1.10 (H) 0.20 - 0.90 x103/uL    EOSINOPHIL # 0.10 0.00 - 0.50 x103/uL    BASOPHIL # 0.00 0.00 - 0.10 x103/uL     Labs reviewed and interpreted by me.    ED PROGRESS NOTE / MEDICAL DECISION MAKING     Old records reviewed by me:  I have reviewed the nurse's notes. I have reviewed the patient's problem list.     Orders Placed This Encounter    COMPREHENSIVE METABOLIC PROFILE - [facility] ONLY    LIPASE    URINALYSIS WITH MICROSCOPIC REFLEX IF INDICATED [facility] ONLY    CBC WITH DIFF    INSERT & MAINTAIN PERIPHERAL IV ACCESS    NS flush syringe    promethazine (PHENERGAN) 12.5mg  in NS 50mL premix IVPB    ketorolac (TORADOL) 30 mg/mL injection    NS bolus infusion 1,000 mL     1:19 AM: Initial evaluation is complete at this time. I discussed with the patient that I would order labs to further evaluate. I will treat the patient with IV Phenergan, IV Toradol, and IV normal saline. I will reevaluate. Patient is agreeable with the treatment plan at this time.  1:50 AM: Diagnostic studies reviewed at this time.     1:51 AM: On recheck, the patient is the patient is feeling improved after treatment. I explained the results of the diagnostic studies that are very reassuring. Patient will be discharged with Phenergan. Patient is to follow up with his PCP. I discussed the diagnosis, disposition, and  follow-up plan. The patient understood and is in accordance with the treatment plan at this time. Patient is to return here to the Emergency Department if new or worsening symptoms appear. All of their questions have been answered to their satisfaction. The patient is in stable condition at the time of discharge.     Pre-Disposition Vitals:  Filed Vitals:    07/07/15 2356 07/08/15 0100   BP: 130/76 111/85   Pulse: 89 77   Resp: 18 16   Temp: 36.8 C (98.2 F)    SpO2: 99% 100%     CLINICAL IMPRESSION     Encounter Diagnoses   Name Primary?    Gastroenteritis Yes    Dehydration, moderate      DISPOSITION/PLAN     Discharged      Prescriptions:     New Prescriptions    PROMETHAZINE (PHENERGAN) 25 MG ORAL TABLET    Take 1 Tab (25 mg total) by mouth Every 6 hours as needed for nausea/vomiting     Follow-Up:     [facility]  [address]  [place] [address]  [phone]  Call in 2 days    Condition at Disposition: Stable      SCRIBE ATTESTATION STATEMENT  I Ardis Hughs, SCRIBE scribed for Kara Pacer, MD on 07/08/2015 at 1:17 AM.     Documentation assistance provided for Kara Pacer, MD  by Ardis Hughs, SCRIBE. Information recorded by the scribe was done at my direction and has been reviewed and validated by me Kara Pacer, MD.

## 2015-11-15 ENCOUNTER — Emergency Department (HOSPITAL_BASED_OUTPATIENT_CLINIC_OR_DEPARTMENT_OTHER)
Admission: EM | Admit: 2015-11-15 | Discharge: 2015-11-15 | Disposition: A | Discharge status: Home / Self Care | Payer: Self-pay | Attending: Emergency Medicine | Admitting: Emergency Medicine

## 2015-11-15 ENCOUNTER — Emergency Department (HOSPITAL_BASED_OUTPATIENT_CLINIC_OR_DEPARTMENT_OTHER): Payer: Self-pay

## 2015-11-15 DIAGNOSIS — M549 Dorsalgia, unspecified: Secondary | ICD-10-CM | POA: Insufficient documentation

## 2015-11-15 DIAGNOSIS — Z716 Tobacco abuse counseling: Secondary | ICD-10-CM | POA: Insufficient documentation

## 2015-11-15 DIAGNOSIS — F17200 Nicotine dependence, unspecified, uncomplicated: Secondary | ICD-10-CM | POA: Insufficient documentation

## 2015-11-15 MED ORDER — DIAZEPAM 5 MG TABLET
5.00 mg | ORAL_TABLET | Freq: Four times a day (QID) | ORAL | 0 refills | Status: AC | PRN
Start: 2015-11-15 — End: ?

## 2015-11-15 MED ORDER — DICLOFENAC SODIUM 50 MG TABLET,DELAYED RELEASE
50.00 mg | DELAYED_RELEASE_TABLET | Freq: Two times a day (BID) | ORAL | 0 refills | Status: AC | PRN
Start: 2015-11-15 — End: ?

## 2015-11-15 NOTE — ED Triage Notes (Signed)
Right sided back pain for 3 days. Reports history of sciatica.

## 2015-11-15 NOTE — ED Nurses Note (Signed)
Patient discharged home with family.  AVS reviewed with patient/care giver.  A written copy of the AVS and discharge instructions was given to the patient/care giver.  Questions sufficiently answered as needed.  Patient/care giver encouraged to follow up with PCP as indicated.  In the event of an emergency, patient/care giver instructed to call 911 or go to the nearest emergency room.        Current Discharge Medication List      START taking these medications.       Details    diazePAM 5 mg Tablet   Commonly known as:  VALIUM    5 mg, Oral, Q6H PRN   Qty:  12 Tab   Refills:  0       diclofenac sodium 50 mg Tablet, Delayed Release (E.C.)   Commonly known as:  VOLTAREN    50 mg, Oral, 2x/day PRN   Qty:  10 Tab   Refills:  0         STOP taking these medications.          promethazine 25 mg Tablet   Commonly known as:  PHENERGAN

## 2015-11-15 NOTE — Discharge Instructions (Signed)
Back Pain (Acute or Chronic)    Back pain is one of the most common problems. The good news is that most people feel better in 1 to 2 weeks, and most of the rest in 1 to 2 months. Most people can remain active.  People experience and describe pain differently; not everyone is the same.   The pain can be sharp, stabbing, shooting, aching, cramping or burning.   Movement, standing, bending, lifting, sitting, or walking may worsen pain.   It can be localized to one spot or area, or it can be more generalized.   It can spread or radiate upwards, to the front, or go down your arms or legs (sciatica).   It can cause muscle spasm.  Most of the time, mechanical problems with the musclesor spine cause the pain. Mechanical problemsare usually caused by an injury to the muscles or ligaments. While illness can cause back pain, it is usually not caused by a serious illness. Mechanical problems include:   Physical activity such as sports, exercise, work, or normal activity   Overexertion, lifting, pushing, pulling incorrectly or too aggressively   Sudden twisting, bending, or stretching from an accident, or accidental movement   Poor posture   Stretching or moving wrong, without noticing pain at the time   Poor coordination, lack of regular exercise (check with your doctor about this)   Spinal disc disease or arthritis   Stress  Pain can also be related to pregnancy, or illness like appendicitis, bladder or kidney infections, pelvic infections, and many other things.  Acute back pain usually gets better in1 to 2 weeks. Back pain related to disk disease, arthritis in the spinal joints or spinal stenosis (narrowing of the spinal canal) can become chronic and last for months or years.  Unless you had a physical injury (for example, a car accident or fall) X-rays are usually not needed for the initial evaluation of back pain. If pain continues and does not respond to medical treatment, X-rays and other tests may be  needed.  Home care  Try these home care recommendations:   When in bed, tryto find a position of comfort. A firm mattress is best. Try lying flat on your back with pillows under your knees. You can also try lying on your side with your knees bent up towards your chest and a pillow between your knees.   At first, do not try to stretch out the sore spots. If there is a strain, it is not like the good soreness you get after exercising without an injury. In this case, stretching may make it worse.   Avoid prolong sitting, long car rides, or travel. This puts more stress on the lower back than standing or walking.   During the first 24 to 72 hours after an acute injury or flare up of chronic back pain, apply an ice pack to the painful area for 20 minutes and then remove it for 20 minutes. Do this over a period of 60 to 90 minutes or several times a day. This will reduce swelling and pain. Wrap the ice pack in a thin towel or plastic to protect your skin.   You can start with ice, then switch to heat. Heat (hot shower, hot bath, or heating pad) reduces pain and works well for muscle spasms. Heat can be applied to the painful area for 20 minutes then remove it for 20 minutes. Do this over a period of 60 to 90 minutes or several times   a day. Do not sleep on a heating pad. It can lead to skin burns or tissue damage.   You can alternate ice and heat therapy. Talk with your doctor aboutthe best treatment for your back pain.   Therapeutic massage can help relax the back muscles without stretching them.   Be aware of safe lifting methods and do not lift anything without stretching first.  Medicines  Talk to your doctor before using medicine, especially if you have other medical problems or are taking other medicines.   You may use over-the-counter medicine as directed on the bottle to control pain, unless another pain medicine was prescribed. If you have chronic conditions like diabetes, liver or kidney disease,  stomach ulcers, or gastrointestinal bleeding, or are taking blood thinners, talk to your doctor before taking any medicine.   Be careful if you are given a prescription medicines, narcotics, or medicine for muscle spasms. They can cause drowsiness, affect your coordination, reflexes, and judgement. Do not drive or operate heavy machinery.  Follow-up care  Follow up with your healthcare provider, or as advised.  A radiologist will review any X-rays that were taken. Your provide will notify you of any new findings that may affect your care.  Call 911  Call emergency services if any of the following occur:   Trouble breathing   Confusion   Very drowsy or trouble awakening   Fainting or loss of consciousness   Rapid or very slow heart rate   Loss of bowel or bladder control  When to seek medical advice  Call your healthcare provider right away if any of these occur:   Pain becomes worse or spreads to your legs   Weakness or numbness in one or both legs   Numbness in the groin or genital area  Date Last Reviewed: 08/06/2014   2000-2017 [employer] [address], [place] [address]. All rights reserved. This information is not intended as a substitute for professional medical care. Always follow your healthcare professional's instructions.

## 2015-11-15 NOTE — ED Provider Notes (Signed)
[facility]  Emergency Department Visit Note    Date: 11/15/2015  Primary care provider: None Given  Means of arrival: private car  History obtained by: patient  History limited by: none      Chief Complaint: Back pain    History of Present Illness     Stanley Anderson, date of birth [birth year], is a 45 y.o. male who presents to the Emergency Department complaining of back pain.    Context:  Patient states right back pain for the last 3 days.  Patient has a history of sciatica.  States 10/10 aching pain.  Denies abdominal pain.  Denies fever/chills.  Denies n/v.  Denies urinary symptoms.  Denies CP or SOB.  States that he was lifting a heavy bag of garbage when the pain started.  States has had back problems in the past.  No pre-er treatment or meds.  No other c/o.  Pertinent Past Medical History:  See nursing  Onset:  3 days  Timing:  Constant  Location/Radiation:  Right lower back  Quality:  Aching  Severity:  10/10  Modifying Factors:  As above  Associated Symptoms:   Positive:  As above  Negative:  As above    Review of Systems     The pertinent positive and negative symptoms are as per HPI. All other systems reviewed and are negative.    Patient History      Past Medical History:  Past Medical History:   Diagnosis Date    Asthma        Past Surgical History:  No past surgical history on file.    Family History:  Family Medical History     None              Social History:  Social History   Substance Use Topics    Smoking status: Current Every Day Smoker     Packs/day: 0.50    Smokeless tobacco: Not on file    Alcohol use Yes      Comment: occ     History   Drug Use No       Medications:  Outpatient Prescriptions Marked as Taking for the 11/15/15 encounter [facility] Encounter)   Medication Sig    diazePAM (VALIUM) 5 mg Oral Tablet Take 1 Tab (5 mg total) by mouth Every 6 hours as needed (muscle pain)    diclofenac sodium (VOLTAREN) 50 mg Oral Tablet, Delayed Release  (E.C.) Take 1 Tab (50 mg total) by mouth Twice per day as needed       Allergies:   No Known Allergies    Physical Exam     Vital Signs:  ED Triage Vitals   Enc Vitals Group      BP (Non-Invasive) 11/15/15 1229 129/72      Heart Rate 11/15/15 1229 96      Respiratory Rate 11/15/15 1229 16      Temperature 11/15/15 1229 36.9 C (98.5 F)      Temp src --       SpO2-1 11/15/15 1229 98 %      Weight 11/15/15 1229 65.2 kg (143 lb 11.2 oz)      Height 11/15/15 1229 1.753 m (5\' 9" )      Head Cir --       Peak Flow --       Pain Score --       Pain Loc --       Pain  Edu? --       Excl. in GC? --        The initial visit vital signs are reviewed as above.     Pulse Ox: 98% on None (Room Air); interpreted by me ZO:[id number]:Normal    GENERAL:  This is a well appearing 45yom patient who is interactive, appropriate and showing no outward signs of distress.  Non-toxic.  Ambulatory.  HEAD:  Atraumatic, normocephalic.  HEENT:  Conjunctival WNL, no gross deformity noted.  Mucous membranes moist.  NECK:  Supple, no meningeal signs.  HEART: RRR  LUNGS: CTA  ABDOMEN:  Soft.  Non-tender.  Normal sensation to touch.    BACK:  Pain to palpation diffusely throughout lumbar spine musculature and right SI area without point specific tenderness, step off or deformity present.  Normal SLR.  Patellar DTR's WNL.  Heel and toe rises easily.  Ambulatory without difficulty.  SKIN:  Warm, good color.  No rashes noted.  NEURO/PSYCH:  Patient is interactive, appropriate and grossly intact.      Diagnostics     Radiology:   CT LUMBAR SPINE WO IV CONTRAST:  Mild L3-4 and L4-5 disc bulges. No acute findings     ED Progress Note/Medical Decision Making       Orders Placed This Encounter    CT LUMBAR SPINE WO IV CONTRAST    diazePAM (VALIUM) 5 mg Oral Tablet    diclofenac sodium (VOLTAREN) 50 mg Oral Tablet, Delayed Release (E.C.)       Patient was evaluated.  CT as above.  Patient will be given valium and voltaren and to use ice/heat to the area.  To have f/u  with PMD and return for worsening or concerns.  Related understanding to treatment and plan and was d/c to home in stable condition.   Supervising physician: Dr. Erin SonsLaRusso    Pre-hypertension/hypertension:  The patient has been informed that they may have pre-hypertension or hypertension based on an elevated blood pressure reading in the ED.  I recommend that the patient call the provider listed on their discharge instructions or a physician of their choice to arrange follow up for further evaluation of possible pre-hypertension or hypertension as soon as possible.    I have screened the patient for tobacco use and the patient is a tobacco user.  I have counseled the patient for less than 3 minutes to quit using tobacco products due to their multiple adverse health effects.    Pre-Disposition Vitals:  Filed Vitals:    11/15/15 1229   BP: 129/72   Pulse: 96   Resp: 16   Temp: 36.9 C (98.5 F)   SpO2: 98%       Clinical Impression      1. Acute back pain    Plan/Disposition     Discharged    Prescriptions:  New Prescriptions    DIAZEPAM (VALIUM) 5 MG ORAL TABLET    Take 1 Tab (5 mg total) by mouth Every 6 hours as needed (muscle pain)    DICLOFENAC SODIUM (VOLTAREN) 50 MG ORAL TABLET, DELAYED RELEASE (E.C.)    Take 1 Tab (50 mg total) by mouth Twice per day as needed       Follow Up:  Gean Birchwoodabuena, Philomela, MD  [address]  [place] [id number]  [phone]    Schedule an appointment as soon as possible for a visit      [facility]  [address]  [place] [id number]  [phone]  If symptoms worsen          Condition on Disposition: Stable

## 2016-04-29 ENCOUNTER — Encounter (HOSPITAL_COMMUNITY): Payer: Self-pay

## 2016-04-29 ENCOUNTER — Emergency Department (HOSPITAL_COMMUNITY)
Admission: EM | Admit: 2016-04-29 | Discharge: 2016-04-29 | Disposition: A | Discharge status: Home / Self Care | Payer: Self-pay | Attending: Emergency Medicine | Admitting: Emergency Medicine

## 2016-04-29 ENCOUNTER — Emergency Department (HOSPITAL_COMMUNITY): Payer: Self-pay

## 2016-04-29 DIAGNOSIS — Y999 Unspecified external cause status: Secondary | ICD-10-CM | POA: Insufficient documentation

## 2016-04-29 DIAGNOSIS — Z79899 Other long term (current) drug therapy: Secondary | ICD-10-CM | POA: Insufficient documentation

## 2016-04-29 DIAGNOSIS — Y939 Activity, unspecified: Secondary | ICD-10-CM | POA: Insufficient documentation

## 2016-04-29 DIAGNOSIS — Y929 Unspecified place or not applicable: Secondary | ICD-10-CM | POA: Insufficient documentation

## 2016-04-29 DIAGNOSIS — J45909 Unspecified asthma, uncomplicated: Secondary | ICD-10-CM | POA: Insufficient documentation

## 2016-04-29 DIAGNOSIS — Z7982 Long term (current) use of aspirin: Secondary | ICD-10-CM | POA: Insufficient documentation

## 2016-04-29 DIAGNOSIS — X500XXA Overexertion from strenuous movement or load, initial encounter: Secondary | ICD-10-CM | POA: Insufficient documentation

## 2016-04-29 DIAGNOSIS — S29011A Strain of muscle and tendon of front wall of thorax, initial encounter: Secondary | ICD-10-CM | POA: Insufficient documentation

## 2016-04-29 DIAGNOSIS — F17210 Nicotine dependence, cigarettes, uncomplicated: Secondary | ICD-10-CM | POA: Insufficient documentation

## 2016-04-29 DIAGNOSIS — J0101 Acute recurrent maxillary sinusitis: Secondary | ICD-10-CM | POA: Insufficient documentation

## 2016-04-29 MED ORDER — CYCLOBENZAPRINE HCL 10 MG PO TABS
10.0000 mg | ORAL_TABLET | Freq: Once | ORAL | Status: AC
  Administered 2016-04-29: 10 mg via ORAL
  Filled 2016-04-29: qty 1

## 2016-04-29 MED ORDER — AMOXICILLIN-POT CLAVULANATE 875-125 MG PO TABS: 1.0000 | ORAL_TABLET | Freq: Two times a day (BID) | ORAL | 0 refills | Status: AC

## 2016-04-29 MED ORDER — NAPROXEN 375 MG PO TABS
375.0000 mg | ORAL_TABLET | Freq: Two times a day (BID) | ORAL | 0 refills | Status: AC | PRN
Start: 2016-04-29 — End: 2016-05-06

## 2016-04-29 MED ORDER — KETOROLAC TROMETHAMINE 60 MG/2ML IM SOLN
60.0000 mg | Freq: Once | INTRAMUSCULAR | Status: AC
  Administered 2016-04-29: 60 mg via INTRAMUSCULAR
  Filled 2016-04-29: qty 2

## 2016-04-29 MED ORDER — CYCLOBENZAPRINE HCL 10 MG PO TABS: 10.0000 mg | ORAL_TABLET | Freq: Three times a day (TID) | ORAL | 0 refills | Status: DC | PRN

## 2016-04-29 NOTE — ED Notes (Signed)
Patient transported to X-ray 

## 2016-04-29 NOTE — ED Provider Notes (Signed)
[facility] Provider Note   CSN: [id number] Arrival date & time: 04/29/16  1925   By signing my name below, I, Patrick Solomon, attest that this documentation has been prepared under the direction and in the presence of Shaune Pollack, MD. Electronically signed, Patrick Solomon, ED Scribe. 04/29/16. 8:00 PM.   History   Chief Complaint Chief Complaint  Patient presents with  . Headache  . Back Pain   The history is provided by the patient and medical records. No language interpreter was used.    HPI Comments: Patrick Solomon is a 46 y.o. male with Hx of sinus problems and sciatica who presents to the Emergency Department complaining of frontal headache. The headache is an aching, throbbing, dull frontal headache. He states his headache is worsened with leaning forward. Pt notes associated nasal drainage and congestion. He adds baseline neck pain without significant change. No ear pain, fever, or cough. No neck stiffness.  Pt also c/o constant, worsening L sided chest wall pain between the ribs after lifting furniture yesterday. He notes it feels like "someone punched him in his side" and he describes "sore" pain. Pt denies SOB, abdominal pain and Hx of broken ribs. Pain is worse with movement and palpation. No alleviating factors beyond staying still. No direct trauma.  Past Medical History:  Diagnosis Date  . Asthma   . Back pain   . Sciatica     There are no active problems to display for this patient.   History reviewed. No pertinent surgical history.     Home Medications    Prior to Admission medications   Medication Sig Start Date End Date Taking? Authorizing Provider  amoxicillin-clavulanate (AUGMENTIN) 875-125 MG tablet Take 1 tablet by mouth every 12 (twelve) hours. 04/29/16 05/09/16  Shaune Pollack, MD  aspirin-acetaminophen-caffeine (EXCEDRIN MIGRAINE) [phone] MG per tablet Take 2 tablets by mouth every 6 (six) hours as needed for headache.    Historical  Provider, MD  cyclobenzaprine (FLEXERIL) 10 MG tablet Take 1 tablet (10 mg total) by mouth 3 (three) times daily as needed for muscle spasms. 04/29/16   Shaune Pollack, MD  diclofenac (VOLTAREN) 75 MG EC tablet Take 1 tablet (75 mg total) by mouth 2 (two) times daily. Patient not taking: Reported on 04/14/2014 04/06/13   Ivery Quale, PA-C  dicyclomine (BENTYL) 20 MG tablet Take 1 tablet (20 mg total) by mouth 2 (two) times daily. 04/14/14   Loren Racer, MD  Doxylamine-DM & DM 6.25-15 & 15 MG/15ML LQPK Take 30 mLs by mouth every 6 (six) hours as needed (for cough/cold).    Historical Provider, MD  HYDROcodone-acetaminophen (NORCO) 5-325 MG per tablet Take 1-2 tablets by mouth every 4 (four) hours as needed for severe pain. 04/14/14   Loren Racer, MD  levofloxacin (LEVAQUIN) 750 MG tablet Take 1 tablet (750 mg total) by mouth daily. X 7 days 04/14/14   Loren Racer, MD  Multiple Vitamin (ONE-A-DAY MENS PO) Take 1 tablet by mouth daily.    Historical Provider, MD  naproxen (NAPROSYN) 375 MG tablet Take 1 tablet (375 mg total) by mouth 2 (two) times daily as needed for moderate pain. 04/29/16 05/06/16  Shaune Pollack, MD  ondansetron (ZOFRAN ODT) 4 MG disintegrating tablet 4mg  ODT q4 hours prn nausea/vomit 04/14/14   Loren Racer, MD  traMADol Janean Sark) 50 MG tablet 1 or 2 po q6h prn pain Patient not taking: Reported on 04/14/2014 04/06/13   Ivery Quale, PA-C    Family History History reviewed. No pertinent family history.  Social History Social History  Substance Use Topics  . Smoking status: Current Every Day Smoker    Packs/day: 0.25    Types: Cigarettes  . Smokeless tobacco: Never Used  . Alcohol use Yes     Comment: 1-2 times a month     Allergies   Food   Review of Systems Review of Systems  Constitutional: Negative for chills, fatigue and fever.  HENT: Positive for congestion, rhinorrhea and sinus pressure. Negative for ear pain.   Eyes: Negative for visual disturbance.    Respiratory: Negative for cough, shortness of breath and wheezing.   Cardiovascular: Negative for chest pain and leg swelling.  Gastrointestinal: Negative for abdominal pain, diarrhea, nausea and vomiting.  Genitourinary: Negative for dysuria and flank pain.  Musculoskeletal: Negative for myalgias, neck pain and neck stiffness.  Skin: Negative for rash and wound.  Allergic/Immunologic: Negative for immunocompromised state.  Neurological: Positive for headaches. Negative for syncope and weakness.  All other systems reviewed and are negative.    Physical Exam Updated Vital Signs BP 136/87 (BP Location: Left Arm)   Pulse 86   Temp 98.7 F (37.1 C) (Oral)   Resp 18   SpO2 99%   Physical Exam  Constitutional: He is oriented to person, place, and time. He appears well-developed and well-nourished. No distress.  HENT:  Head: Normocephalic and atraumatic.  Mouth/Throat: Oropharynx is clear and moist.  Moderate bilateral edema of nasal turbinates. Mild posterior pharyngeal erythema w/o tonsillar exudates. Uvula is midline. Moderate tenderness with percussion of maxillary sinuses b/l. No frontal sinus tenderness.  Eyes: Conjunctivae are normal.  Neck: Normal range of motion. Neck supple.  Mild bilateral paraspinal tenderness. No midline tenderness. No neck rigidity.  Cardiovascular: Normal rate, regular rhythm and normal heart sounds.  Exam reveals no friction rub.   No murmur heard. Pulmonary/Chest: Effort normal and breath sounds normal. No respiratory distress. He has no wheezes. He has no rales. He exhibits tenderness (Pinpoint TTP over left lateral, inferior rib spaces, worse in intercostal spaces, with no bruising or deformity).    Abdominal: He exhibits no distension.  Musculoskeletal: He exhibits no edema.  Lymphadenopathy:    He has no cervical adenopathy.  Neurological: He is alert and oriented to person, place, and time. He exhibits normal muscle tone.  Skin: Skin is warm.  Capillary refill takes less than 2 seconds.  Psychiatric: He has a normal mood and affect.  Nursing note and vitals reviewed.    ED Treatments / Results  DIAGNOSTIC STUDIES: Oxygen Saturation is 99% on RA, normal by my interpretation.    COORDINATION OF CARE: 7:57 PM Discussed treatment plan with pt at bedside and pt agreed to plan. Will order imaging and medications.  Labs (all labs ordered are listed, but only abnormal results are displayed) Labs Reviewed - No data to display  EKG  EKG Interpretation None       Radiology No results found.  Procedures Procedures (including critical care time)  Medications Ordered in ED Medications  cyclobenzaprine (FLEXERIL) tablet 10 mg (10 mg Oral Given 04/29/16 2003)  ketorolac (TORADOL) injection 60 mg (60 mg Intramuscular Given 04/29/16 2004)     Initial Impression / Assessment and Plan / ED Course  I have reviewed the triage vital signs and the nursing notes.  Pertinent labs & imaging results that were available during my care of the patient were reviewed by me and considered in my medical decision making (see chart for details).     46 yo  M with PMHx as above here with headache and chest wall pain.  Regarding his HA, this is likely 2/2 sinusitis. He has nasal congestion, maxillary sinus TTP b/l and history of the same. No facial erythema, swelling, or evidence to suggest abscess. He has no photophobia, fever, neck stiffness, rigidity, or evidence ot suggest meningitis or encephalitis. Not diabetic or immune suppressed. No focal neuro deficits to suggest intracranial mass/lesion, CVA, or SAH. Will treat with augmentin, nsaids.  Regarding his chest wall pain, this began after lifting/moving and exam is c/w intercostal msk chest wall pain. CXR without PNA, PTX, or bony abnormality. CP is reproducible, pinpoint, and do not suspect ACS. Pain began after lifting, and he has normal HR, normal RR, normal O2 Sats - doubt PE. Will tx with  NSAIDs as above, flexeril, and d/c home.  Final Clinical Impressions(s) / ED Diagnoses   Final diagnoses:  Acute recurrent maxillary sinusitis  Intercostal muscle strain, initial encounter    New Prescriptions New Prescriptions   AMOXICILLIN-CLAVULANATE (AUGMENTIN) 875-125 MG TABLET    Take 1 tablet by mouth every 12 (twelve) hours.   CYCLOBENZAPRINE (FLEXERIL) 10 MG TABLET    Take 1 tablet (10 mg total) by mouth 3 (three) times daily as needed for muscle spasms.   NAPROXEN (NAPROSYN) 375 MG TABLET    Take 1 tablet (375 mg total) by mouth 2 (two) times daily as needed for moderate pain.   I personally performed the services described in this documentation, which was scribed in my presence. The recorded information has been reviewed and is accurate.    Shaune Pollackameron Reata Petrov, MD 04/29/16 2252

## 2016-04-29 NOTE — ED Triage Notes (Signed)
Pt complaining of L sided back pain and headache. Pt states lifting heavy furniture yesterday.

## 2016-06-22 ENCOUNTER — Encounter (HOSPITAL_COMMUNITY): Payer: Self-pay

## 2016-06-22 ENCOUNTER — Emergency Department (HOSPITAL_COMMUNITY)
Admission: EM | Admit: 2016-06-22 | Discharge: 2016-06-22 | Disposition: A | Discharge status: Home / Self Care | Payer: Self-pay | Attending: Emergency Medicine | Admitting: Emergency Medicine

## 2016-06-22 DIAGNOSIS — Z7982 Long term (current) use of aspirin: Secondary | ICD-10-CM | POA: Insufficient documentation

## 2016-06-22 DIAGNOSIS — Z79899 Other long term (current) drug therapy: Secondary | ICD-10-CM | POA: Insufficient documentation

## 2016-06-22 DIAGNOSIS — J45909 Unspecified asthma, uncomplicated: Secondary | ICD-10-CM | POA: Insufficient documentation

## 2016-06-22 DIAGNOSIS — L723 Sebaceous cyst: Secondary | ICD-10-CM | POA: Insufficient documentation

## 2016-06-22 DIAGNOSIS — F17210 Nicotine dependence, cigarettes, uncomplicated: Secondary | ICD-10-CM | POA: Insufficient documentation

## 2016-06-22 MED ORDER — LIDOCAINE HCL (PF) 1 % IJ SOLN
INTRAMUSCULAR | Status: AC
  Filled 2016-06-22: qty 5

## 2016-06-22 MED ORDER — LIDOCAINE-EPINEPHRINE 2 %-1:100000 IJ SOLN
20.0000 mL | Freq: Once | INTRAMUSCULAR | Status: DC
  Filled 2016-06-22: qty 20

## 2016-06-22 NOTE — Discharge Instructions (Signed)
Please read and follow all provided instructions.  Your diagnoses today include:  1. Inflamed sebaceous cyst     Tests performed today include:  Vital signs. See below for your results today.   Medications prescribed:   None  Take any prescribed medications only as directed.   Home care instructions:   Follow any educational materials contained in this packet  Follow-up instructions: Return to the Emergency Department in 48 hours for a recheck if your symptoms are not significantly improved.  Please follow-up with your primary care provider in the next 1 week for further evaluation of your symptoms.   Return instructions:  Return to the Emergency Department if you have:  Fever  Worsening symptoms  Worsening pain  Worsening swelling  Redness of the skin that moves away from the affected area, especially if it streaks away from the affected area   Any other emergent concerns  Your vital signs today were: BP (!) 135/98 (BP Location: Left Arm)    Pulse 89    Temp 98.5 F (36.9 C) (Oral)    Resp 18    Ht 5\' 9"  (1.753 m)    Wt 140 lb (63.5 kg)    SpO2 100%    BMI 20.67 kg/m  If your blood pressure (BP) was elevated above 135/85 this visit, please have this repeated by your doctor within one month. --------------

## 2016-06-22 NOTE — ED Triage Notes (Signed)
Per Pt, Pt is coming from home with complaints of left ear abscess that started a month ago. No drainage at this time.

## 2016-06-22 NOTE — ED Notes (Signed)
Pt is in stable condition upon d/c and ambulates from ED. 

## 2016-06-22 NOTE — ED Provider Notes (Signed)
[facility] Provider Note   CSN: [id number] Arrival date & time: 06/22/16  4098  By signing my name below, I, Patrick Solomon, attest that this documentation has been prepared under the direction and in the presence of Josh Aislin Onofre, PA-C. Electronically Signed: Linna Solomon, Scribe. 06/22/2016. 9:38 AM.  History   Chief Complaint Chief Complaint  Patient presents with  . Abscess   The history is provided by the patient. No language interpreter was used.    HPI Comments: Patrick Solomon is a 46 y.o. male who presents to the Emergency Department complaining of a moderate, gradually worsening area of pain and swelling to his left jaw just underneath the ear for one month. He has no h/o the same. He has been squeezing the area without any subsequent drainage or improvement of the pain or swelling. No medications tried. No h/o immunocompromised conditions. Patient denies fevers, chills, spontaneous drainage from the area, or any other associated symptoms.  Past Medical History:  Diagnosis Date  . Asthma   . Back pain   . Sciatica     There are no active problems to display for this patient.   History reviewed. No pertinent surgical history.     Home Medications    Prior to Admission medications   Medication Sig Start Date End Date Taking? Authorizing Provider  aspirin-acetaminophen-caffeine (EXCEDRIN MIGRAINE) [phone] MG per tablet Take 2 tablets by mouth every 6 (six) hours as needed for headache.    [provider]  cyclobenzaprine (FLEXERIL) 10 MG tablet Take 1 tablet (10 mg total) by mouth 3 (three) times daily as needed for muscle spasms. 04/29/16   Shaune Pollack, MD  dicyclomine (BENTYL) 20 MG tablet Take 1 tablet (20 mg total) by mouth 2 (two) times daily. 04/14/14   Loren Racer, MD  Doxylamine-DM & DM 6.25-15 & 15 MG/15ML LQPK Take 30 mLs by mouth every 6 (six) hours as needed (for cough/cold).    [provider]  HYDROcodone-acetaminophen  (NORCO) 5-325 MG per tablet Take 1-2 tablets by mouth every 4 (four) hours as needed for severe pain. 04/14/14   Loren Racer, MD  levofloxacin (LEVAQUIN) 750 MG tablet Take 1 tablet (750 mg total) by mouth daily. X 7 days 04/14/14   Loren Racer, MD  Multiple Vitamin (ONE-A-DAY MENS PO) Take 1 tablet by mouth daily.    [provider]  ondansetron (ZOFRAN ODT) 4 MG disintegrating tablet 4mg  ODT q4 hours prn nausea/vomit 04/14/14   Loren Racer, MD  traMADol Janean Sark) 50 MG tablet 1 or 2 po q6h prn pain Patient not taking: Reported on 04/14/2014 04/06/13   Ivery Quale, PA-C    Family History No family history on file.  Social History Social History  Substance Use Topics  . Smoking status: Current Every Day Smoker    Packs/day: 0.50    Types: Cigarettes  . Smokeless tobacco: Never Used  . Alcohol use Yes     Comment: 1-2 times a month     Allergies   Food   Review of Systems Review of Systems  Constitutional: Negative for chills and fever.  Gastrointestinal: Negative for nausea and vomiting.  Skin: Positive for wound. Negative for color change.       Positive for abscess  Allergic/Immunologic: Negative for immunocompromised state.  Hematological: Negative for adenopathy.   Physical Exam Updated Vital Signs BP (!) 135/98 (BP Location: Left Arm)   Pulse 89   Temp 98.5 F (36.9 C) (Oral)   Resp 18   Ht  5\' 9"  (1.753 m)   Wt 140 lb (63.5 kg)   SpO2 100%   BMI 20.67 kg/m   Physical Exam  Constitutional: He appears well-developed and well-nourished.  HENT:  Head: Normocephalic and atraumatic.  Eyes: Conjunctivae are normal.  Neck: Normal range of motion. Neck supple.  Pulmonary/Chest: No respiratory distress.  Neurological: He is alert.  Skin: Skin is warm and dry.  1cm x 1cm nodule noted just inferior to the left pinna. No significant overlying erythema, no surrounding cellulitis.  Psychiatric: He has a normal mood and affect.  Nursing note and vitals  reviewed.  ED Treatments / Results  Labs (all labs ordered are listed, but only abnormal results are displayed) Labs Reviewed - No data to display  EKG  EKG Interpretation None       Radiology No results found.  Procedures .Marland Kitchen.Incision and Drainage Date/Time: 06/22/2016 10:05 AM Performed by: Renne CriglerGEIPLE, Stephane Junkins Authorized by: Renne CriglerGEIPLE, Tynika Luddy   Consent:    Consent obtained:  Verbal   Consent given by:  Patient Location:    Type:  Cyst   Location:  Head   Head location:  Face Pre-procedure details:    Skin preparation:  Betadine Anesthesia (see MAR for exact dosages):    Anesthesia method:  Local infiltration   Local anesthetic:  Lidocaine 1% w/o epi Procedure type:    Complexity:  Simple Procedure details:    Incision types:  Single straight   Incision depth:  Dermal   Wound management:  Probed and deloculated   Drainage:  Purulent   Drainage amount:  Moderate   Wound treatment:  Wound left open   Packing materials:  None Post-procedure details:    Patient tolerance of procedure:  Tolerated well, no immediate complications   (including critical care time)  DIAGNOSTIC STUDIES: Oxygen Saturation is 100% on RA, normal by my interpretation.    COORDINATION OF CARE: 9:38 AM Discussed treatment plan with pt at bedside and pt agreed to plan.  Medications Ordered in ED Medications  lidocaine-EPINEPHrine (XYLOCAINE W/EPI) 2 %-1:100000 (with pres) injection 20 mL (not administered)  lidocaine (PF) (XYLOCAINE) 1 % injection (not administered)     Initial Impression / Assessment and Plan / ED Course  I have reviewed the triage vital signs and the nursing notes.  Pertinent labs & imaging results that were available during my care of the patient were reviewed by me and considered in my medical decision making (see chart for details).  Patient with sebaceous cyst. Incision and drainage performed in the ED today.  Cyst was not large enough to warrant packing or drain  placement. Supportive care and return precautions discussed. The patient appears reasonably screened and/or stabilized for discharge and I doubt any other emergent medical condition requiring further screening, evaluation, or treatment in the ED prior to discharge.  The patient was urged to return to the Emergency Department urgently with worsening pain, swelling, expanding erythema especially if it streaks away from the affected area, fever, or if they have any other concerns.   The patient was urged to return to the Emergency Department or go to their PCP in 48 hours for wound recheck if the area is not significantly improved.  The patient verbalized understanding and stated agreement with this plan.     Final Clinical Impressions(s) / ED Diagnoses   Final diagnoses:  Inflamed sebaceous cyst   Patient with inflamed, bothersome sebaceous cyst. Incision and drainage performed as above with good expression of waxy material.  New Prescriptions  Current Discharge Medication List     I personally performed the services described in this documentation, which was scribed in my presence. The recorded information has been reviewed and is accurate.    Renne Crigler, PA-C 06/22/16 1011    Charlynne Pander, MD 06/23/16 [phone]

## 2016-08-19 ENCOUNTER — Emergency Department (HOSPITAL_COMMUNITY)
Admission: EM | Admit: 2016-08-19 | Discharge: 2016-08-19 | Disposition: A | Discharge status: Home / Self Care | Payer: Self-pay | Attending: Emergency Medicine | Admitting: Emergency Medicine

## 2016-08-19 ENCOUNTER — Encounter (HOSPITAL_COMMUNITY): Payer: Self-pay

## 2016-08-19 DIAGNOSIS — Y9289 Other specified places as the place of occurrence of the external cause: Secondary | ICD-10-CM | POA: Insufficient documentation

## 2016-08-19 DIAGNOSIS — T22011A Burn of unspecified degree of right forearm, initial encounter: Secondary | ICD-10-CM | POA: Insufficient documentation

## 2016-08-19 DIAGNOSIS — Y9389 Activity, other specified: Secondary | ICD-10-CM | POA: Insufficient documentation

## 2016-08-19 DIAGNOSIS — F17210 Nicotine dependence, cigarettes, uncomplicated: Secondary | ICD-10-CM | POA: Insufficient documentation

## 2016-08-19 DIAGNOSIS — J45909 Unspecified asthma, uncomplicated: Secondary | ICD-10-CM | POA: Insufficient documentation

## 2016-08-19 DIAGNOSIS — Y990 Civilian activity done for income or pay: Secondary | ICD-10-CM | POA: Insufficient documentation

## 2016-08-19 DIAGNOSIS — X102XXA Contact with fats and cooking oils, initial encounter: Secondary | ICD-10-CM | POA: Insufficient documentation

## 2016-08-19 MED ORDER — BACITRACIN ZINC 500 UNIT/GM EX OINT
TOPICAL_OINTMENT | Freq: Once | CUTANEOUS | Status: AC
  Administered 2016-08-19: 1 via TOPICAL

## 2016-08-19 NOTE — ED Notes (Signed)
Pt stable, ambulatory, states understanding of discharge instructions 
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
## 2016-08-21 ENCOUNTER — Encounter (HOSPITAL_COMMUNITY): Payer: Self-pay | Admitting: Emergency Medicine

## 2016-08-21 DIAGNOSIS — Z79899 Other long term (current) drug therapy: Secondary | ICD-10-CM | POA: Insufficient documentation

## 2016-08-21 DIAGNOSIS — M5431 Sciatica, right side: Secondary | ICD-10-CM | POA: Insufficient documentation

## 2016-08-21 DIAGNOSIS — S39012A Strain of muscle, fascia and tendon of lower back, initial encounter: Secondary | ICD-10-CM | POA: Insufficient documentation

## 2016-08-21 DIAGNOSIS — Y9389 Activity, other specified: Secondary | ICD-10-CM | POA: Insufficient documentation

## 2016-08-21 DIAGNOSIS — Y92009 Unspecified place in unspecified non-institutional (private) residence as the place of occurrence of the external cause: Secondary | ICD-10-CM | POA: Insufficient documentation

## 2016-08-21 DIAGNOSIS — X500XXA Overexertion from strenuous movement or load, initial encounter: Secondary | ICD-10-CM | POA: Insufficient documentation

## 2016-08-21 DIAGNOSIS — Z7982 Long term (current) use of aspirin: Secondary | ICD-10-CM | POA: Insufficient documentation

## 2016-08-21 DIAGNOSIS — Y998 Other external cause status: Secondary | ICD-10-CM | POA: Insufficient documentation

## 2016-08-21 DIAGNOSIS — F17210 Nicotine dependence, cigarettes, uncomplicated: Secondary | ICD-10-CM | POA: Insufficient documentation

## 2016-08-21 DIAGNOSIS — J45909 Unspecified asthma, uncomplicated: Secondary | ICD-10-CM | POA: Insufficient documentation

## 2016-08-21 NOTE — ED Triage Notes (Signed)
Pt c/o back pain with bending after lifting a tv last night. Pt ambulatory to the triage room.

## 2016-08-22 ENCOUNTER — Emergency Department (HOSPITAL_COMMUNITY)
Admission: EM | Admit: 2016-08-22 | Discharge: 2016-08-22 | Disposition: A | Discharge status: Home / Self Care | Payer: Self-pay | Attending: Emergency Medicine | Admitting: Emergency Medicine

## 2016-08-22 DIAGNOSIS — M5431 Sciatica, right side: Secondary | ICD-10-CM

## 2016-08-22 DIAGNOSIS — S39012A Strain of muscle, fascia and tendon of lower back, initial encounter: Secondary | ICD-10-CM

## 2016-08-22 MED ORDER — PREDNISONE 10 MG PO TABS: 40.0000 mg | ORAL_TABLET | Freq: Every day | ORAL | 0 refills | Status: AC

## 2016-08-22 MED ORDER — METHOCARBAMOL 500 MG PO TABS
500.0000 mg | ORAL_TABLET | Freq: Once | ORAL | Status: AC
  Administered 2016-08-22: 500 mg via ORAL
  Filled 2016-08-22: qty 1

## 2016-08-22 MED ORDER — METHOCARBAMOL 500 MG PO TABS: 500.0000 mg | ORAL_TABLET | Freq: Every evening | ORAL | 0 refills | Status: DC | PRN

## 2016-08-22 MED ORDER — PREDNISONE 20 MG PO TABS
60.0000 mg | ORAL_TABLET | Freq: Once | ORAL | Status: AC
  Administered 2016-08-22: 60 mg via ORAL
  Filled 2016-08-22: qty 3

## 2016-08-22 NOTE — ED Provider Notes (Signed)
[facility]-EMERGENCY DEPT Provider Note   CSN: [id number] Arrival date & time: 08/21/16  2052     History   Chief Complaint Chief Complaint  Patient presents with  . Back Pain    HPI Patrick Solomon is a 46 y.o. male presenting with sudden onset lower back pain after lifting a 55 inch flat screen TV on his own from the floor. He states that he felt something when he does that the TV but was okay until he woke up today feeling pain and worsening of his left-sided sciatica. He explains that he knows exactly what he did and strained his back. He denies fever, chills, malignancy, history of IV drug use, loss of bowel or bladder function or any other concerning symptoms.  HPI  Past Medical History:  Diagnosis Date  . Asthma   . Back pain   . Sciatica     There are no active problems to display for this patient.   History reviewed. No pertinent surgical history.     Home Medications    Prior to Admission medications   Medication Sig Start Date End Date Taking? Authorizing Provider  aspirin-acetaminophen-caffeine (EXCEDRIN MIGRAINE) [phone] MG per tablet Take 2 tablets by mouth every 6 (six) hours as needed for headache.    [provider]  cyclobenzaprine (FLEXERIL) 10 MG tablet Take 1 tablet (10 mg total) by mouth 3 (three) times daily as needed for muscle spasms. 04/29/16   Shaune Pollack, MD  dicyclomine (BENTYL) 20 MG tablet Take 1 tablet (20 mg total) by mouth 2 (two) times daily. 04/14/14   Loren Racer, MD  Doxylamine-DM & DM 6.25-15 & 15 MG/15ML LQPK Take 30 mLs by mouth every 6 (six) hours as needed (for cough/cold).    [provider]  HYDROcodone-acetaminophen (NORCO) 5-325 MG per tablet Take 1-2 tablets by mouth every 4 (four) hours as needed for severe pain. 04/14/14   Loren Racer, MD  levofloxacin (LEVAQUIN) 750 MG tablet Take 1 tablet (750 mg total) by mouth daily. X 7 days 04/14/14   Loren Racer, MD  methocarbamol (ROBAXIN) 500 MG tablet  Take 1 tablet (500 mg total) by mouth at bedtime as needed for muscle spasms. 08/22/16   Georgiana Shore, PA-C  Multiple Vitamin (ONE-A-DAY MENS PO) Take 1 tablet by mouth daily.    [provider]  ondansetron (ZOFRAN ODT) 4 MG disintegrating tablet 4mg  ODT q4 hours prn nausea/vomit 04/14/14   Loren Racer, MD  predniSONE (DELTASONE) 10 MG tablet Take 4 tablets (40 mg total) by mouth daily. 08/22/16 08/26/16  Georgiana Shore, PA-C  traMADol Janean Sark) 50 MG tablet 1 or 2 po q6h prn pain Patient not taking: Reported on 04/14/2014 04/06/13   Ivery Quale, PA-C    Family History No family history on file.  Social History Social History  Substance Use Topics  . Smoking status: Current Every Day Smoker    Packs/day: 0.50    Types: Cigarettes  . Smokeless tobacco: Never Used  . Alcohol use Yes     Comment: 1-2 times a month     Allergies   Food   Review of Systems Review of Systems  Constitutional: Negative for chills and fever.  HENT: Negative for ear pain and sore throat.   Eyes: Negative for pain and visual disturbance.  Respiratory: Negative for cough, shortness of breath, wheezing and stridor.   Cardiovascular: Negative for chest pain and palpitations.  Gastrointestinal: Negative for abdominal pain, nausea and vomiting.  Genitourinary: Negative for dysuria  and hematuria.  Musculoskeletal: Positive for back pain and myalgias. Negative for arthralgias, gait problem, joint swelling, neck pain and neck stiffness.  Skin: Negative for color change, pallor, rash and wound.  Neurological: Positive for numbness. Negative for dizziness, seizures, syncope and weakness.       Which he describes as his sciatica radiating tingling in his left leg     Physical Exam Updated Vital Signs BP 126/88   Pulse 88   Temp 98.4 F (36.9 C) (Oral)   Resp 16   Ht 5\' 9"  (1.753 m)   Wt 63.5 kg (140 lb)   SpO2 100%   BMI 20.67 kg/m   Physical Exam  Constitutional: He appears  well-developed and well-nourished. No distress.  Afebrile, nontoxic-appearing, lying comfortably in bed in no acute distress.  HENT:  Head: Normocephalic and atraumatic.  Neck: Normal range of motion. Neck supple.  Cardiovascular: Normal rate, regular rhythm, normal heart sounds and intact distal pulses.   No murmur heard. Pulmonary/Chest: Effort normal and breath sounds normal. No respiratory distress. He has no wheezes. He has no rales.  Abdominal: He exhibits no distension.  Musculoskeletal: Normal range of motion. He exhibits tenderness. He exhibits no edema or deformity.  No midline tenderness of the entire spine. Patient is tender to palpation of the lower lumbar musculature. Neurovascularly intact distally. 5 out of 5 strength to flexion and dorsiflexion at the knee, plantar flexion dorsiflexion and hip flexion . Normal stance and gait.  Neurological: He is alert. No sensory deficit.  Skin: Skin is warm and dry. Capillary refill takes less than 2 seconds. No rash noted. He is not diaphoretic. No erythema. No pallor.  Psychiatric: He has a normal mood and affect.  Nursing note and vitals reviewed.    ED Treatments / Results  Labs (all labs ordered are listed, but only abnormal results are displayed) Labs Reviewed - No data to display  EKG  EKG Interpretation None       Radiology No results found.  Procedures Procedures (including critical care time)  Medications Ordered in ED Medications  predniSONE (DELTASONE) tablet 60 mg (60 mg Oral Given 08/22/16 0133)  methocarbamol (ROBAXIN) tablet 500 mg (500 mg Oral Given 08/22/16 0138)     Initial Impression / Assessment and Plan / ED Course  I have reviewed the triage vital signs and the nursing notes.  Pertinent labs & imaging results that were available during my care of the patient were reviewed by me and considered in my medical decision making (see chart for details).    Patient presents with lower back pain.  No  gross neurological deficits and normal neuro exam.  Patient has no gait abnormality or concern for cauda equina.  No loss of bowel or bladder control, fever, night sweats, weight loss, h/o malignancy, or IVDU.    RICE protocol and pain medications indicated and discussed with patient.  Patient's pain was managed in the emergency department. Patient will be discharged home with follow-up with PCP as needed.  Discussed strict return precautions and advised to return to the emergency department if experiencing any new or worsening symptoms. Instructions were understood and patient agreed with discharge plan.  Final Clinical Impressions(s) / ED Diagnoses   Final diagnoses:  Strain of lumbar region, initial encounter  Sciatica of right side    New Prescriptions Discharge Medication List as of 08/22/2016  1:35 AM    START taking these medications   Details  methocarbamol (ROBAXIN) 500 MG tablet Take 1  tablet (500 mg total) by mouth at bedtime as needed for muscle spasms., Starting Wed 08/22/2016, Print    predniSONE (DELTASONE) 10 MG tablet Take 4 tablets (40 mg total) by mouth daily., Starting Wed 08/22/2016, Until Sun 08/26/2016, Print         Mathews Robinsons B, PA-C 08/22/16 0147    Ward, Layla Maw, DO 08/22/16 8119

## 2016-08-22 NOTE — ED Notes (Signed)
Heat packs provided.  

## 2016-08-22 NOTE — Discharge Instructions (Signed)
As discussed, The medicine prescribed can help with muscle spasm but cannot betaken if driving, with alcohol or operating machinery.  Prednisone for the next 4 days. Follow up with the wellness center if symptoms persist beyond a week. Return if experiencing, loss of bowel or bladder function, weakness, difficulty walking or other concerning symptoms in the meantime.

## 2016-10-11 IMAGING — CT CT ABD-PELV W/ CM
1 of 3 series · 14 of 32 positions shown, 19 images · IV contrast (100 ML OMNI 300)
Comparison: None.

CLINICAL DATA: Left lower quadrant and left buttock pain. Nausea,
vomiting, diarrhea and chills.

EXAM:
CT ABDOMEN AND PELVIS WITH CONTRAST
TECHNIQUE: Multidetector CT imaging of the abdomen and pelvis was performed
using the standard protocol following bolus administration of
intravenous contrast.
CONTRAST:  100mL OMNIPAQUE IOHEXOL 300 MG/ML  SOLN

[Series 2: abd/pel with · axial · 0.69mm/px · z∈[-412,-42]mm · 14 of 84 slices shown, 19 images]
[im 5/84  soft-tissue]
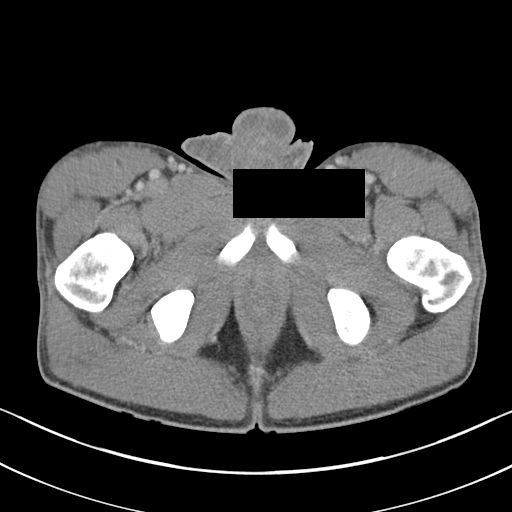
[im 5/84  bone]
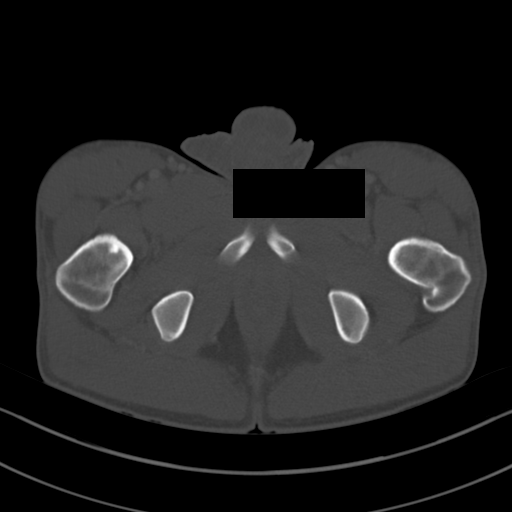
[im 13/84  soft-tissue]
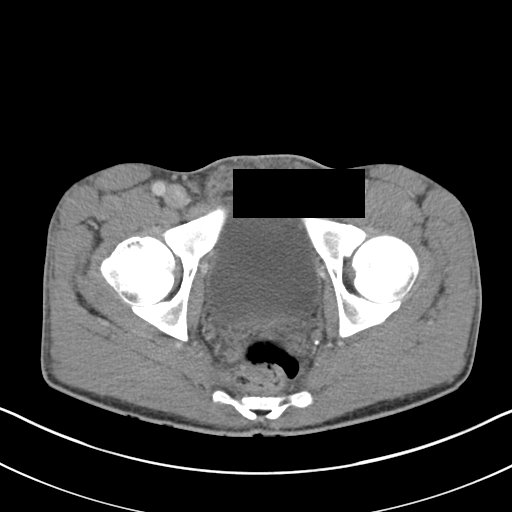
[im 17/84  soft-tissue]
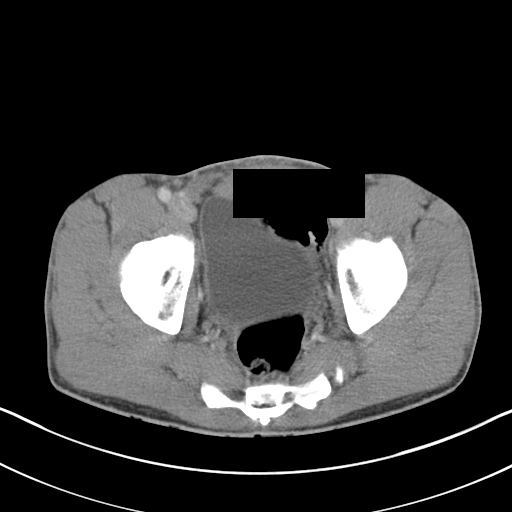
[im 25/84  soft-tissue]
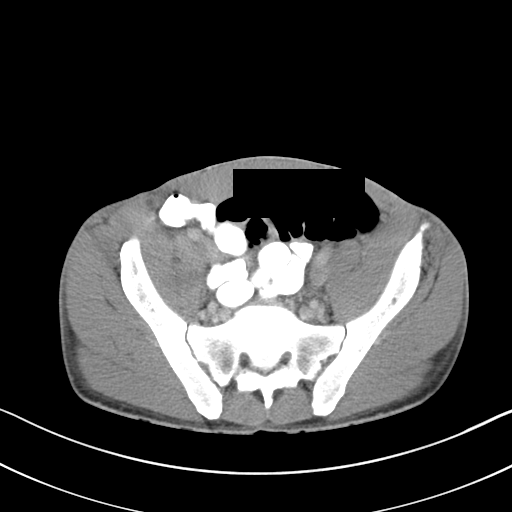
[im 30/84  soft-tissue]
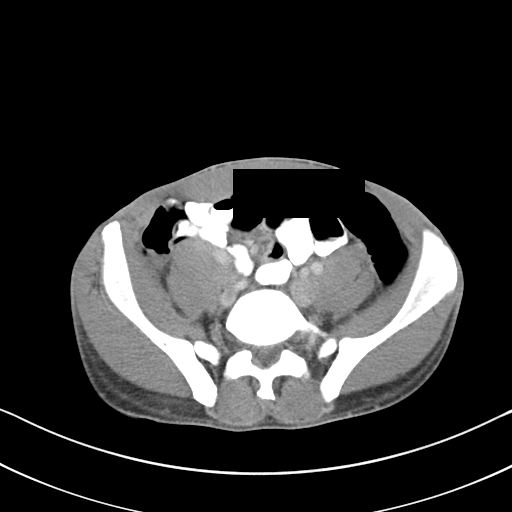
[im 38/84  soft-tissue]
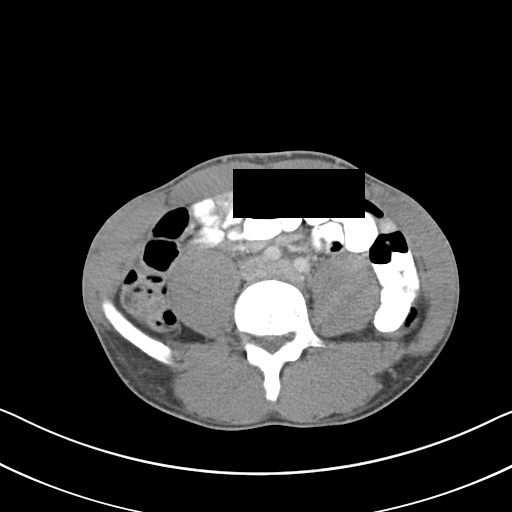
[im 42/84  soft-tissue]
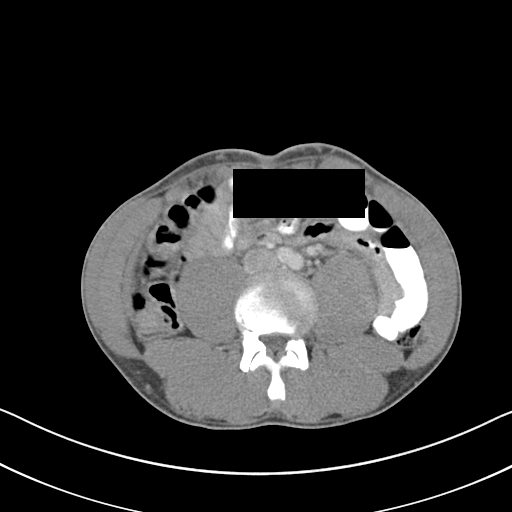
[im 46/84  soft-tissue]
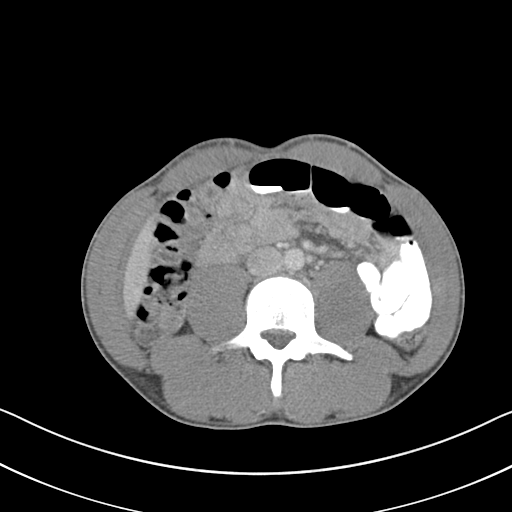
[im 54/84  soft-tissue]
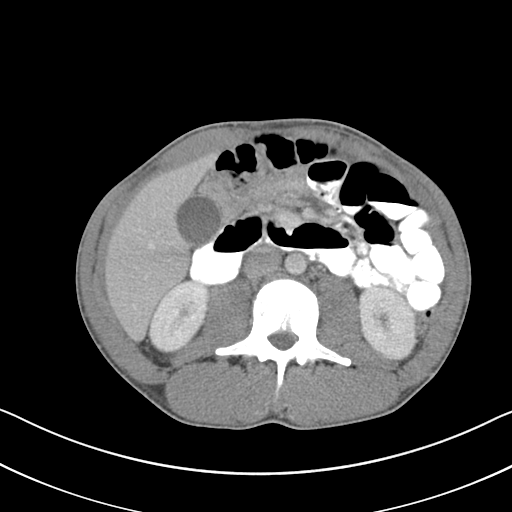
[im 54/84  bone]
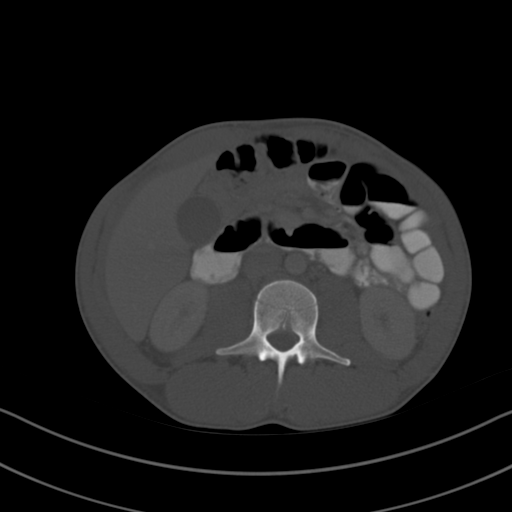
[im 59/84  soft-tissue]
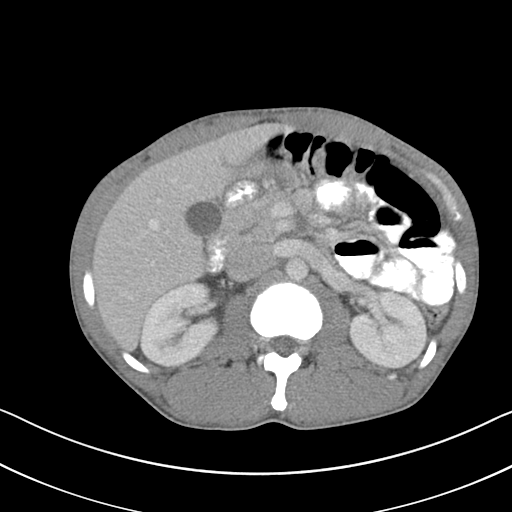
[im 67/84  soft-tissue]
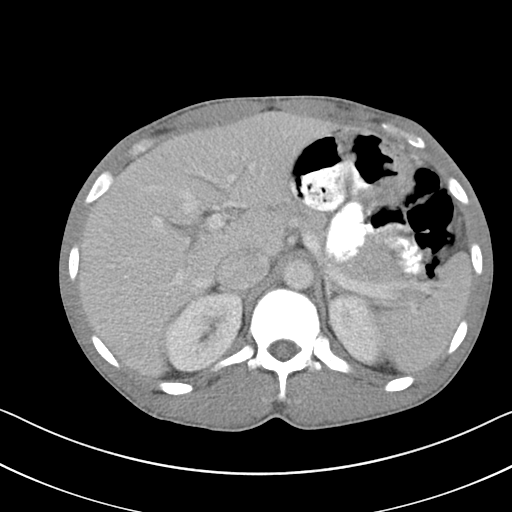
[im 67/84  lung]
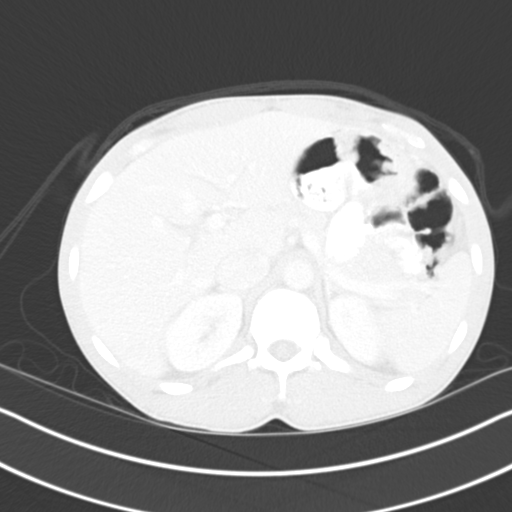
[im 71/84  soft-tissue]
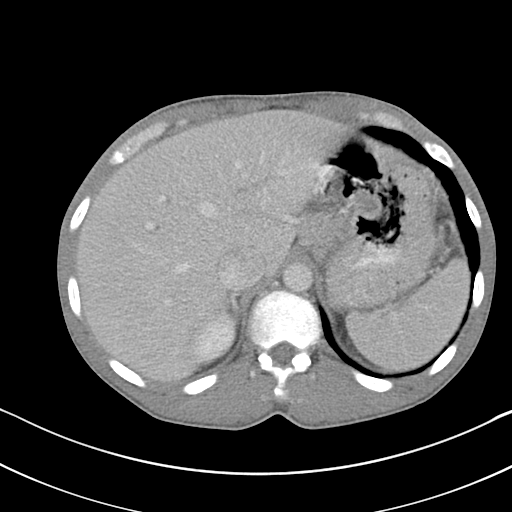
[im 71/84  lung]
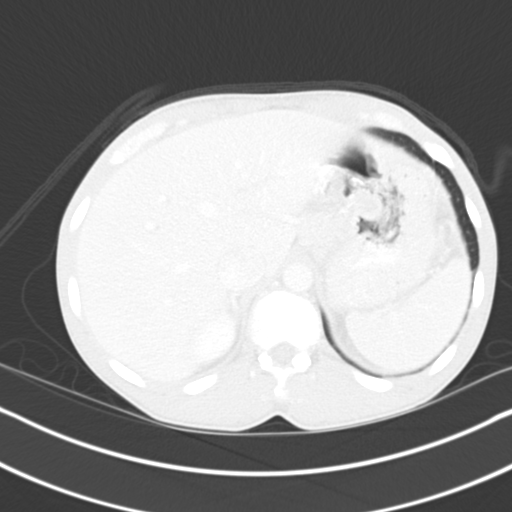
[im 75/84  lung]
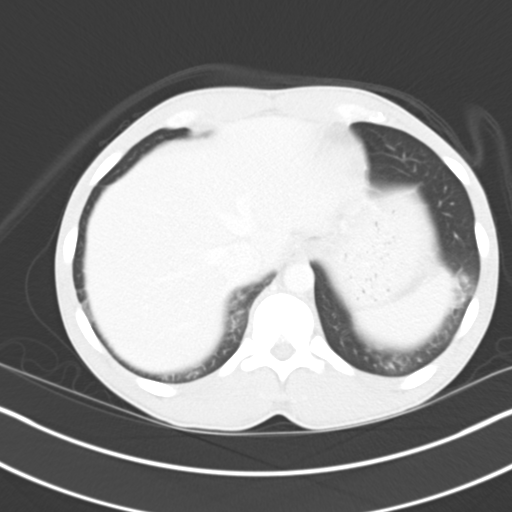
[im 79/84  soft-tissue]
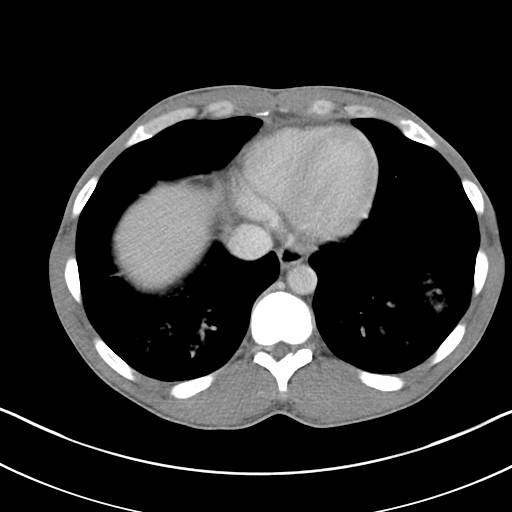
[im 79/84  lung]
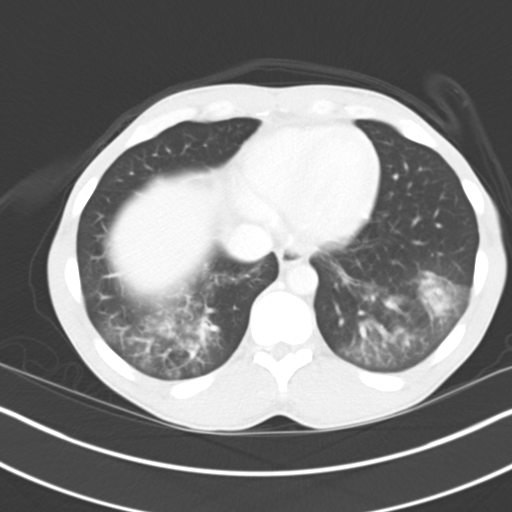

[14 of 32 positions shown; findings below may reference images not displayed]

FINDINGS: Patchy airspace opacity in both lower lobes, left greater than
right, no pleural effusion.

There are no focal hepatic lesions. The gallbladder is
physiologically distended. No pericholecystic inflammatory change.
There is mild central intrahepatic biliary ductal dilatation, left
greater than right. Common bile duct appears prominent at the porta
hepatis measuring 8 mm. The spleen, pancreas, and adrenal glands are
normal. Kidneys demonstrate symmetric enhancement and excretion
without hydronephrosis or focal renal abnormality.

The stomach is physiologically distended. There are no dilated or
thickened small bowel loops. Small volume of stool throughout the
colon. No evidence of perirectal fluid collection. The appendix is
air-filled and normal. No free air, free fluid, or intra-abdominal
fluid collection.

The abdominal aorta is normal in caliber. No retroperitoneal
adenopathy.

Within the pelvis the bladder is physiologically distended. Prostate
gland is normal in size. No pelvic free fluid. Small pelvic
phleboliths noted.

There are no acute or suspicious osseous abnormalities. Bone island
is seen in the right proximal femur.
IMPRESSION: 1. Patchy airspace opacity in both lower lobes in the included lung
bases, concerning for pneumonia.
2. Mild biliary prominence, a common bowel duct measures 8 mm, mild
central intrahepatic biliary dilatation. This may be a spurious
finding, however further evaluation could be considered with MRCP if
patient is able tolerate breath hold technique.

## 2016-11-23 ENCOUNTER — Encounter (HOSPITAL_COMMUNITY): Payer: Self-pay | Admitting: Emergency Medicine

## 2016-11-23 DIAGNOSIS — F17210 Nicotine dependence, cigarettes, uncomplicated: Secondary | ICD-10-CM | POA: Insufficient documentation

## 2016-11-23 DIAGNOSIS — M5432 Sciatica, left side: Secondary | ICD-10-CM | POA: Insufficient documentation

## 2016-11-23 DIAGNOSIS — J45909 Unspecified asthma, uncomplicated: Secondary | ICD-10-CM | POA: Insufficient documentation

## 2016-11-23 DIAGNOSIS — Z79899 Other long term (current) drug therapy: Secondary | ICD-10-CM | POA: Insufficient documentation

## 2016-11-23 NOTE — ED Triage Notes (Signed)
History of chronic lower back pain/sciatica.  Reports flare-up of back pain after bending over at work this morning.  Pain radiates down L leg.

## 2016-11-24 ENCOUNTER — Emergency Department (HOSPITAL_COMMUNITY)
Admission: EM | Admit: 2016-11-24 | Discharge: 2016-11-24 | Disposition: A | Discharge status: Home / Self Care | Payer: Self-pay | Attending: Emergency Medicine | Admitting: Emergency Medicine

## 2016-11-24 DIAGNOSIS — M5432 Sciatica, left side: Secondary | ICD-10-CM

## 2016-11-24 MED ORDER — METHOCARBAMOL 500 MG PO TABS: 500.0000 mg | ORAL_TABLET | Freq: Every evening | ORAL | 0 refills | Status: DC | PRN

## 2016-11-24 MED ORDER — METHYLPREDNISOLONE 4 MG PO TBPK: ORAL_TABLET | ORAL | 0 refills | Status: DC

## 2016-11-24 NOTE — ED Provider Notes (Signed)
[facility] Provider [facility]   CSN: [id number] Arrival date & time: 11/23/16  2112     History   Chief Complaint Chief Complaint  Patient presents with  . Back Pain    HPI Patrick CanterDekelvin Solomon is a 46 y.o. male with PMH of sciatica who presents to ED for evaluation of 1 day history of left-sided back pain radiating down left leg. States that this feels similar to his previous episodes of sciatica. States that he was at work when he bent forward picking up a heavy object. Pain began shortly thereafter. He denies any numbness in legs, urinary incontinence, falls, injuries, prior back surgery, history of cancer, history of IV drug use, fevers. He has been ambulatory since pain began.  HPI  Past Medical History:  Diagnosis Date  . Asthma   . Back pain   . Sciatica     There are no active problems to display for this patient.   History reviewed. No pertinent surgical history.     Home Medications    Prior to Admission medications   Medication Sig Start Date End Date Taking? Authorizing Provider  aspirin-acetaminophen-caffeine (EXCEDRIN MIGRAINE) [phone] [id number] MG per tablet Take 2 tablets by mouth every 6 (six) hours as needed for headache.    [provider]  cyclobenzaprine (FLEXERIL) 10 MG tablet Take 1 tablet (10 mg total) by mouth 3 (three) times daily as needed for muscle spasms. 04/29/16   Shaune PollackIsaacs, Cameron, MD  dicyclomine (BENTYL) 20 MG tablet Take 1 tablet (20 mg total) by mouth 2 (two) times daily. 04/14/14   Loren RacerYelverton, David, MD  Doxylamine-DM & DM 6.25-15 & 15 MG/15ML LQPK Take 30 mLs by mouth every 6 (six) hours as needed (for cough/cold).    [provider]  HYDROcodone-acetaminophen (NORCO) 5-325 MG per tablet Take 1-2 tablets by mouth every 4 (four) hours as needed for severe pain. 04/14/14   Loren RacerYelverton, David, MD  levofloxacin (LEVAQUIN) 750 MG tablet Take 1 tablet (750 mg total) by mouth daily. X 7 days 04/14/14    Loren RacerYelverton, David, MD  methocarbamol (ROBAXIN) 500 MG tablet Take 1 tablet (500 mg total) by mouth at bedtime as needed for muscle spasms. 11/24/16   Timia Casselman, PA-C  methylPREDNISolone (MEDROL DOSEPAK) 4 MG TBPK tablet Taper over 6 days. 11/24/16   Keena Heesch, PA-C  Multiple Vitamin (ONE-A-DAY MENS PO) Take 1 tablet by mouth daily.    [provider]  ondansetron (ZOFRAN ODT) 4 MG disintegrating tablet 4mg  ODT q4 hours prn nausea/vomit 04/14/14   Loren RacerYelverton, David, MD  traMADol Janean Sark(ULTRAM) 50 MG tablet 1 or 2 po q6h prn pain Patient not taking: Reported on 04/14/2014 04/06/13   Ivery QualeBryant, Hobson, PA-C    Family History No family history on file.  Social History Social History  Substance Use Topics  . Smoking status: Current Every Day Smoker    Packs/day: 0.50    Types: Cigarettes  . Smokeless tobacco: Never Used  . Alcohol use Yes     Comment: 1-2 times a month     Allergies   Food   Review of Systems Review of Systems  Constitutional: Negative for chills and fever.  Genitourinary: Negative for dysuria and flank pain.  Musculoskeletal: Positive for back pain. Negative for myalgias, neck pain and neck stiffness.  Skin: Negative for wound.  Neurological: Negative for weakness and numbness.     Physical Exam Updated Vital Signs BP 102/68 (BP Location: Right Arm)   Pulse 78  Temp 98.7 F (37.1 C) (Oral)   Resp 12   SpO2 100%   Physical Exam  Constitutional: He appears well-developed and well-nourished. No distress.  HENT:  Head: Normocephalic and atraumatic.  Eyes: Conjunctivae and EOM are normal. No scleral icterus.  Neck: Normal range of motion.  Pulmonary/Chest: Effort normal. No respiratory distress.  Musculoskeletal: Normal range of motion. He exhibits tenderness. He exhibits no edema or deformity.       Arms: No midline spinal tenderness present in lumbar, thoracic or cervical spine. No step-off palpated. No visible bruising, edema or temperature change  noted. No objective signs of numbness present. No saddle anesthesia. 2+ DP pulses bilaterally. Sensation intact to light touch. Strength 5/5 in bilateral lower extremities.  Neurological: He is alert.  Skin: No rash noted. He is not diaphoretic.  Psychiatric: He has a normal mood and affect.  Nursing note and vitals reviewed.    ED Treatments / Results  Labs (all labs ordered are listed, but only abnormal results are displayed) Labs Reviewed - No data to display  EKG  EKG Interpretation None       Radiology No results found.  Procedures Procedures (including critical care time)  Medications Ordered in ED Medications - No data to display   Initial Impression / Assessment and Plan / ED Course  I have reviewed the triage vital signs and the nursing notes.  Pertinent labs & imaging results that were available during my care of the patient were reviewed by me and considered in my medical decision making (see chart for details).     Patient, with a past medical history of sciatica, presents to ED for evaluation of left-sided back pain. He states this feels similar to his prior episodes of sciatica. This episode began while he was at work and he bent over to pick up a heavy object. He denies any falls, injuries. On physical exam he has nontoxic appearing and in no acute distress. He has no deficits on neurological exam. No numbness, weakness, evidence of urinary or bowel incontinence noted. He is afebrile with no history of fever. He denies any previous back surgery, history of cancer, history of IV drug use. His pain is reproducible with palpation over the left buttock. I suspect that this is just a flareup of his sciatica. He states that his steroids and muscle relaxers usually help with him. He have low suspicion for frequent or other acute spinal cord giving the cause of his back pain. We'll discharge home with Medrol Dosepak and Robaxin to be taken as needed. Advised to follow-up  at [facility] for further evaluation. Patient appears stable for discharge at this time. Strict return precautions given.  Final Clinical Impressions(s) / ED Diagnoses   Final diagnoses:  Sciatica of left side    New Prescriptions Discharge Medication List as of 11/24/2016  3:49 AM    START taking these medications   Details  methylPREDNISolone (MEDROL DOSEPAK) 4 MG TBPK tablet Taper over 6 days., [address]         Dietrich Pates, PA-C 11/24/16 1610    Dione Booze, MD 11/24/16 [phone]

## 2016-11-24 NOTE — ED Notes (Signed)
Pt. Unable to sign due to technical difficulties. Pt. Made aware.

## 2016-11-24 NOTE — ED Notes (Signed)
No answer for treatment room. 

## 2016-11-24 NOTE — Discharge Instructions (Signed)
Please read attached information regarding your condition. Apply heating pad to affected area and stretch as tolerated. Take steroids and taper dose pack as directed. Take Robaxin as needed for muscle pain and spasms. Eturn toED for worsening pain, numbness and legs, inability to walk, falls, injuries, loss of bladder function.

## 2017-01-12 ENCOUNTER — Encounter (HOSPITAL_COMMUNITY): Payer: Self-pay | Admitting: Emergency Medicine

## 2017-01-12 ENCOUNTER — Other Ambulatory Visit: Payer: Self-pay

## 2017-01-12 ENCOUNTER — Emergency Department (HOSPITAL_COMMUNITY)
Admission: EM | Admit: 2017-01-12 | Discharge: 2017-01-12 | Disposition: A | Discharge status: Home / Self Care | Payer: Self-pay | Attending: Emergency Medicine | Admitting: Emergency Medicine

## 2017-01-12 DIAGNOSIS — Z79899 Other long term (current) drug therapy: Secondary | ICD-10-CM | POA: Insufficient documentation

## 2017-01-12 DIAGNOSIS — J45909 Unspecified asthma, uncomplicated: Secondary | ICD-10-CM | POA: Insufficient documentation

## 2017-01-12 DIAGNOSIS — F17210 Nicotine dependence, cigarettes, uncomplicated: Secondary | ICD-10-CM | POA: Insufficient documentation

## 2017-01-12 DIAGNOSIS — M5432 Sciatica, left side: Secondary | ICD-10-CM | POA: Insufficient documentation

## 2017-01-12 MED ORDER — METHOCARBAMOL 500 MG PO TABS: 500.0000 mg | ORAL_TABLET | Freq: Three times a day (TID) | ORAL | 0 refills | Status: AC

## 2017-01-12 MED ORDER — PREDNISONE 10 MG PO TABS: 40.0000 mg | ORAL_TABLET | Freq: Every day | ORAL | 0 refills | Status: AC

## 2017-01-12 NOTE — ED Provider Notes (Addendum)
[facility] EM[facility]GENC[facility] Provider Note   CSN: [id number] Arrival date & time: 01/12/17  1618     History   Chief Complaint Chief Complaint  Patient presents with  . Back Pain    HPI Patrick Solomon is a 46 y.o. male with pertinent pmh of back injury and sciatica presents with left lumbar and buttock pain with radiation to posterior left leg that started today. He played with grandkids yesterday, lifting them.  Patient can walk but states it aggravates back pain.  No red flag symptoms of back pain including: fecal incontinence, urinary retention or overflow incontinence, fevers, h/o cancer, IVDU, recent trauma. No interventions for pain. Aggravated by walking and flexing at hip. Staying still alleviates pain. No ho/ kidney stones.    HPI  Past Medical History:  Diagnosis Date  . Asthma   . Back pain   . Sciatica     There are no active problems to display for this patient.   History reviewed. No pertinent surgical history.     Home Medications    Prior to Admission medications   Medication Sig Start Date End Date Taking? Authorizing Provider  aspirin-acetaminophen-caffeine (EXCEDRIN MIGRAINE) [phone] [id number] MG per tablet Take 2 tablets by mouth every 6 (six) hours as needed for headache.    [provider]  cyclobenzaprine (FLEXERIL) 10 MG tablet Take 1 tablet (10 mg total) by mouth 3 (three) times daily as needed for muscle spasms. 04/29/16   Shaune PollackIsaacs, Cameron, MD  dicyclomine (BENTYL) 20 MG tablet Take 1 tablet (20 mg total) by mouth 2 (two) times daily. 04/14/14   Loren RacerYelverton, David, MD  Doxylamine-DM & DM 6.25-15 & 15 MG/15ML LQPK Take 30 mLs by mouth every 6 (six) hours as needed (for cough/cold).    [provider]  HYDROcodone-acetaminophen (NORCO) 5-325 MG per tablet Take 1-2 tablets by mouth every 4 (four) hours as needed for severe pain. 04/14/14   Loren RacerYelverton, David, MD  levofloxacin (LEVAQUIN) 750 MG tablet Take 1 tablet (750 mg  total) by mouth daily. X 7 days 04/14/14   Loren RacerYelverton, David, MD  methylPREDNISolone (MEDROL DOSEPAK) 4 MG TBPK tablet Taper over 6 days. 11/24/16   Khatri, Hina, PA-C  Multiple Vitamin (ONE-A-DAY MENS PO) Take 1 tablet by mouth daily.    [provider]  ondansetron (ZOFRAN ODT) 4 MG disintegrating tablet 4mg  ODT q4 hours prn nausea/vomit 04/14/14   Loren RacerYelverton, David, MD  traMADol Janean Sark(ULTRAM) 50 MG tablet 1 or 2 po q6h prn pain Patient not taking: Reported on 04/14/2014 04/06/13   Ivery QualeBryant, Hobson, PA-C    Family History No family history on file.  Social History Social History   Tobacco Use  . Smoking status: Current Every Day Smoker    Packs/day: 0.50    Types: Cigarettes  . Smokeless tobacco: Never Used  Substance Use Topics  . Alcohol use: Yes    Comment: 1-2 times a month  . Drug use: No     Allergies   Food   Review of Systems Review of Systems  Musculoskeletal: Positive for back pain.  All other systems reviewed and are negative.    Physical Exam Updated Vital Signs BP 110/80   Pulse 97   Temp 98.6 F (37 C) (Oral)   Resp 16   SpO2 100%   Physical Exam  Constitutional: He appears well-developed and well-nourished. No distress.  HENT:  Head: Normocephalic and atraumatic.  Nose: Nose normal.  Eyes: EOM are normal.  Neck: Phonation normal.  Cardiovascular: Normal rate, S1 normal, S2 normal and normal heart sounds.  Pulses:      Radial pulses are 2+ on the right side, and 2+ on the left side.       Dorsalis pedis pulses are 2+ on the right side, and 2+ on the left side.  Pulmonary/Chest: Effort normal and breath sounds normal. He has no decreased breath sounds. He exhibits no tenderness.  Abdominal: Soft. Normal appearance and bowel sounds are normal. There is no tenderness.  No suprapubic or CVA tenderness   Musculoskeletal: He exhibits tenderness.       Lumbar back: He exhibits tenderness and pain.       Back:  No TL spine midline tenderness or step  offs +L sided lumbar muscular tenderness  +Tenderness to L sciatic notch and SI joint  +SLR on left Full AROM hips without reported pain Negative Pearlean BrownieFaber.  Negative Stinchfield test.   Neurological:  5/5 strength with flexion/extension of hip, knee and ankle, bilaterally.  Sensation to light touch intact in lower extremities including feet  Skin: Skin is warm and dry. Capillary refill takes less than 2 seconds.  Psychiatric: He has a normal mood and affect. His behavior is normal. Judgment and thought content normal.     ED Treatments / Results  Labs (all labs ordered are listed, but only abnormal results are displayed) Labs Reviewed - No data to display  EKG  EKG Interpretation None       Radiology No results found.  Procedures Procedures (including critical care time)  Medications Ordered in ED Medications - No data to display   Initial Impression / Assessment and Plan / ED Course  I have reviewed the triage vital signs and the nursing notes.  Pertinent labs & imaging results that were available during my care of the patient were reviewed by me and considered in my medical decision making (see chart for details).    Patient is a 46 y.o. male presents with back pain. Exam is c/w sciatica. No groin numbness.  Normal lower extremity neurological exam.  No falls.   Doubt compression fx, pyelonephritis, nephrolithiasis, epidural abscess, AAA, dissection, cauda equina.  No red flag symptoms of back pain including: fecal incontinence, urinary retention or overflow incontinence, fevers, h/o cancer, IVDU, recent trauma. No concern for cauda equina, epidural abscess, or other serious cause of back pain at this time.   ED labs and imaging not indicated today as abdominal and MSK exam reassuring and no red flag symptoms present.  I do not suspect bony or intraabdominal/pelvic emergency at this time.Will d/c with robaxin and prednisone. ED return precautions discussed with pt who  verbalized understanding and was agreeable to dispo plan.    Final Clinical Impressions(s) / ED Diagnoses   Final diagnoses:  Sciatica of left side    ED Discharge Orders        Ordered    predniSONE (DELTASONE) 10 MG tablet  Daily     01/12/17 1816    methocarbamol (ROBAXIN) 500 MG tablet  3 times daily     01/12/17 1816       Jerrell MylarGibbons, Vedanth Sirico J, PA-C 01/12/17 1820    Vanetta MuldersZackowski, Scott, MD 01/13/17 1640    Liberty HandyGibbons, Dagny Fiorentino J, PA-C 01/22/17 1154    Vanetta MuldersZackowski, Scott, MD 01/25/17 [phone]

## 2017-01-12 NOTE — ED Triage Notes (Signed)
Pt c/o back pain -- low-- lumbar area, sore after playing with kids and grandkids.

## 2017-01-12 NOTE — Discharge Instructions (Signed)
Your pain is likely from sciatica.   Take robaxin (muscle relaxer) and prednisone (steroid) as prescribed. Use a heating pad. You can purchase an over the counter salonpas lidocaine patch as well and place it on your left lower back for pain control.  Return to ED for groin numbness, bladder or bowel incontinence or retention, abdominal pain, burning with urination, fevers, chills  Contact [facility] to establish care with a primary care provider for regular, routine medical care.  This clinic accepts patients without medical insurance. A primary care provider can adjust your daily medications and give you refills.

## 2017-07-10 ENCOUNTER — Emergency Department (HOSPITAL_COMMUNITY)
Admission: EM | Admit: 2017-07-10 | Discharge: 2017-07-10 | Disposition: A | Discharge status: Home / Self Care | Payer: Self-pay | Attending: Emergency Medicine | Admitting: Emergency Medicine

## 2017-07-10 ENCOUNTER — Emergency Department (HOSPITAL_COMMUNITY): Payer: Self-pay

## 2017-07-10 ENCOUNTER — Encounter (HOSPITAL_COMMUNITY): Payer: Self-pay

## 2017-07-10 DIAGNOSIS — J45909 Unspecified asthma, uncomplicated: Secondary | ICD-10-CM | POA: Insufficient documentation

## 2017-07-10 DIAGNOSIS — J069 Acute upper respiratory infection, unspecified: Secondary | ICD-10-CM | POA: Insufficient documentation

## 2017-07-10 DIAGNOSIS — B9789 Other viral agents as the cause of diseases classified elsewhere: Secondary | ICD-10-CM

## 2017-07-10 DIAGNOSIS — F17210 Nicotine dependence, cigarettes, uncomplicated: Secondary | ICD-10-CM | POA: Insufficient documentation

## 2017-07-10 MED ORDER — ALBUTEROL SULFATE HFA 108 (90 BASE) MCG/ACT IN AERS: 1.0000 | INHALATION_SPRAY | Freq: Four times a day (QID) | RESPIRATORY_TRACT | 0 refills | Status: DC | PRN

## 2017-07-10 MED ORDER — BENZONATATE 100 MG PO CAPS: 100.0000 mg | ORAL_CAPSULE | Freq: Three times a day (TID) | ORAL | 0 refills | Status: DC

## 2017-07-10 MED ORDER — PROMETHAZINE-DM 6.25-15 MG/5ML PO SYRP: 5.0000 mL | ORAL_SOLUTION | Freq: Four times a day (QID) | ORAL | 0 refills | Status: DC | PRN

## 2017-07-10 MED ORDER — PREDNISONE 20 MG PO TABS: 40.0000 mg | ORAL_TABLET | Freq: Every day | ORAL | 0 refills | Status: AC

## 2017-07-10 NOTE — ED Triage Notes (Signed)
Pt reports productive cough for the past week, yellow sputum with body aches and fevers, afebrile now

## 2017-07-10 NOTE — Discharge Instructions (Signed)
You likely have a viral illness.  This should be treated symptomatically. Use Tylenol or ibuprofen as needed for fevers or body aches. Take prednisone daily as prescribed. Use inhaler every 2-4 hours for the first 2 days, then as needed afterwards. Use cough syrups/drops as needed for cough.  Make sure you stay well-hydrated with water. Wash your hands frequently to prevent spread of infection. Your cough may persist for several weeks, however if you are not feeling better in the next 5 days, follow up for reevaluation. Return to the emergency room if you develop chest pain, difficulty breathing, or any new or worsening symptoms.

## 2017-07-11 NOTE — ED Provider Notes (Signed)
[facility] EMERGENCY DEPARTMENT Provider Note   CSN: [id number] Arrival date & time: 07/10/17  2052     History   Chief Complaint Chief Complaint  Patient presents with  . Cough    HPI Patrick Solomon is a 47 y.o. male presenting for evaluation of cough.  Patient states for the past week, he has had a mildly productive cough with yellow sputum.  He has associated nasal congestion and intermittently feeling hot and cold.  He denies ear pain, eye pain, sore throat, chest pain, shortness of breath, nausea, vomiting, abdominal pain.  He denies sick contacts.  He smokes cigarettes daily.  Has a history of asthma, although does not have an inhaler or as needed one for several years.  He has no medical problems, takes no medications daily.  He took Tylenol once, has not tried anything else for his symptoms.  HPI  Past Medical History:  Diagnosis Date  . Asthma   . Back pain   . Sciatica     There are no active problems to display for this patient.   History reviewed. No pertinent surgical history.      Home Medications    Prior to Admission medications   Medication Sig Start Date End Date Taking? Authorizing Provider  albuterol (PROVENTIL HFA;VENTOLIN HFA) 108 (90 Base) MCG/ACT inhaler Inhale 1-2 puffs into the lungs every 6 (six) hours as needed for wheezing or shortness of breath. 07/10/17   Khaidyn Staebell, PA-C  aspirin-acetaminophen-caffeine (EXCEDRIN MIGRAINE) [phone] MG per tablet Take 2 tablets by mouth every 6 (six) hours as needed for headache.    [provider]  benzonatate (TESSALON) 100 MG capsule Take 1 capsule (100 mg total) by mouth every 8 (eight) hours. 07/10/17   Trenyce Loera, PA-C  cyclobenzaprine (FLEXERIL) 10 MG tablet Take 1 tablet (10 mg total) by mouth 3 (three) times daily as needed for muscle spasms. 04/29/16   Shaune Pollack, MD  dicyclomine (BENTYL) 20 MG tablet Take 1 tablet (20 mg total) by mouth 2 (two) times  daily. 04/14/14   Loren Racer, MD  Doxylamine-DM & DM 6.25-15 & 15 MG/15ML LQPK Take 30 mLs by mouth every 6 (six) hours as needed (for cough/cold).    [provider]  HYDROcodone-acetaminophen (NORCO) 5-325 MG per tablet Take 1-2 tablets by mouth every 4 (four) hours as needed for severe pain. 04/14/14   Loren Racer, MD  levofloxacin (LEVAQUIN) 750 MG tablet Take 1 tablet (750 mg total) by mouth daily. X 7 days 04/14/14   Loren Racer, MD  methylPREDNISolone (MEDROL DOSEPAK) 4 MG TBPK tablet Taper over 6 days. 11/24/16   Khatri, Hina, PA-C  Multiple Vitamin (ONE-A-DAY MENS PO) Take 1 tablet by mouth daily.    [provider]  ondansetron (ZOFRAN ODT) 4 MG disintegrating tablet 4mg  ODT q4 hours prn nausea/vomit 04/14/14   Loren Racer, MD  predniSONE (DELTASONE) 20 MG tablet Take 2 tablets (40 mg total) by mouth daily for 5 days. 07/10/17 07/15/17  Adellyn Capek, PA-C  promethazine-dextromethorphan (PROMETHAZINE-DM) 6.25-15 MG/5ML syrup Take 5 mLs by mouth 4 (four) times daily as needed. 07/10/17   Davontay Watlington, PA-C  traMADol (ULTRAM) 50 MG tablet 1 or 2 po q6h prn pain Patient not taking: Reported on 04/14/2014 04/06/13   Ivery Quale, PA-C    Family History No family history on file.  Social History Social History   Tobacco Use  . Smoking status: Current Every Day Smoker    Packs/day: 0.50  Types: Cigarettes  . Smokeless tobacco: Never Used  Substance Use Topics  . Alcohol use: Yes    Comment: 1-2 times a month  . Drug use: No     Allergies   Food   Review of Systems Review of Systems  Constitutional: Positive for fever (Subjective).  HENT: Positive for congestion. Negative for sore throat.   Respiratory: Positive for cough. Negative for shortness of breath and wheezing.   Cardiovascular: Negative for chest pain.     Physical Exam Updated Vital Signs BP 116/78 (BP Location: Right Arm)   Pulse 85   Temp 98.2 F (36.8 C) (Oral)    Resp 18   SpO2 95% Comment: with deep breaths  Physical Exam  Constitutional: He is oriented to person, place, and time. He appears well-developed and well-nourished. No distress.  Patient appears in no distress  HENT:  Head: Normocephalic and atraumatic.  Right Ear: Tympanic membrane, external ear and ear canal normal.  Left Ear: Tympanic membrane, external ear and ear canal normal.  Nose: Mucosal edema present. Right sinus exhibits no maxillary sinus tenderness and no frontal sinus tenderness. Left sinus exhibits no maxillary sinus tenderness and no frontal sinus tenderness.  Mouth/Throat: Uvula is midline, oropharynx is clear and moist and mucous membranes are normal. No tonsillar exudate.  Nasal mucosal edema.  OP clear without tonsillar swelling or exudate.  Uvula midline with equal palate rise.  TMs nonerythematous and nonbulging bilaterally.  Eyes: Pupils are equal, round, and reactive to light. Conjunctivae and EOM are normal.  Neck: Normal range of motion.  Cardiovascular: Normal rate, regular rhythm and intact distal pulses.  Pulmonary/Chest: Effort normal and breath sounds normal. He has no decreased breath sounds. He has no wheezes. He has no rhonchi. He has no rales.  Pt speaking in full sentences without difficulty.  Rhonchi cleared with cough.  Scattered expiratory wheezing.  No accessory muscle use or signs of respiratory distress.  Abdominal: Soft. He exhibits no distension. There is no tenderness.  Musculoskeletal: Normal range of motion.  Lymphadenopathy:    He has no cervical adenopathy.  Neurological: He is alert and oriented to person, place, and time.  Skin: Skin is warm.  Psychiatric: He has a normal mood and affect.  Nursing note and vitals reviewed.    ED Treatments / Results  Labs (all labs ordered are listed, but only abnormal results are displayed) Labs Reviewed - No data to display  EKG None  Radiology Dg Chest 2 View  Result Date:  07/10/2017 CLINICAL DATA:  Acute onset of cough and left lower back pain. Nausea, lack of appetite and fever. Diaphoresis. EXAM: CHEST - 2 VIEW COMPARISON:  Chest radiograph performed 04/29/2016 FINDINGS: The lungs are well-aerated and clear. There is no evidence of focal opacification, pleural effusion or pneumothorax. The heart is normal in size; the mediastinal contour is within normal limits. No acute osseous abnormalities are seen. IMPRESSION: No acute cardiopulmonary process seen. Electronically Signed   By: Roanna RaiderJeffery  Chang M.D.   On: 07/10/2017 22:33    Procedures Procedures (including critical care time)  Medications Ordered in ED Medications - No data to display   Initial Impression / Assessment and Plan / ED Course  I have reviewed the triage vital signs and the nursing notes.  Pertinent labs & imaging results that were available during my care of the patient were reviewed by me and considered in my medical decision making (see chart for details).     Patient presenting for evaluation  of 1 week history of cough.  Physical exam reassuring, patient is afebrile not tachycardic at this time.  He appears nontoxic.  Pulmonary exam shows rhonchi that clear with cough and scattered wheezing.  History of asthma and current smoker, consider bronchitis versus other viral URI.  Chest x-ray reviewed and interpreted by me, no pneumonia, pneumothorax, or effusions.  Discussed with patient.  Discussed treatment with prednisone, albuterol, and antitussives.  Tylenol or ibuprofen as needed.  Discussed hydration.  Follow-up as needed.  At this time, patient appears safe for discharge.  Return precautions given.  Patient states he understands agrees plan.  Final Clinical Impressions(s) / ED Diagnoses   Final diagnoses:  Viral URI with cough    ED Discharge Orders        Ordered    albuterol (PROVENTIL HFA;VENTOLIN HFA) 108 (90 Base) MCG/ACT inhaler  Every 6 hours PRN     07/10/17 2344    predniSONE  (DELTASONE) 20 MG tablet  Daily     07/10/17 2344    benzonatate (TESSALON) 100 MG capsule  Every 8 hours     07/10/17 2344    promethazine-dextromethorphan (PROMETHAZINE-DM) 6.25-15 MG/5ML syrup  4 times daily PRN     07/10/17 2344       Melba Araki, PA-C 07/11/17 0054    Geoffery Lyons, MD 07/11/17 [phone]

## 2017-07-14 ENCOUNTER — Emergency Department (HOSPITAL_COMMUNITY)
Admission: EM | Admit: 2017-07-14 | Discharge: 2017-07-14 | Disposition: A | Discharge status: Home / Self Care | Payer: Self-pay | Attending: Emergency Medicine | Admitting: Emergency Medicine

## 2017-07-14 ENCOUNTER — Encounter (HOSPITAL_COMMUNITY): Payer: Self-pay | Admitting: Emergency Medicine

## 2017-07-14 DIAGNOSIS — Y33XXXA Other specified events, undetermined intent, initial encounter: Secondary | ICD-10-CM | POA: Insufficient documentation

## 2017-07-14 DIAGNOSIS — Z79899 Other long term (current) drug therapy: Secondary | ICD-10-CM | POA: Insufficient documentation

## 2017-07-14 DIAGNOSIS — S39012A Strain of muscle, fascia and tendon of lower back, initial encounter: Secondary | ICD-10-CM | POA: Insufficient documentation

## 2017-07-14 DIAGNOSIS — F17210 Nicotine dependence, cigarettes, uncomplicated: Secondary | ICD-10-CM | POA: Insufficient documentation

## 2017-07-14 DIAGNOSIS — Y998 Other external cause status: Secondary | ICD-10-CM | POA: Insufficient documentation

## 2017-07-14 DIAGNOSIS — J45909 Unspecified asthma, uncomplicated: Secondary | ICD-10-CM | POA: Insufficient documentation

## 2017-07-14 DIAGNOSIS — Y939 Activity, unspecified: Secondary | ICD-10-CM | POA: Insufficient documentation

## 2017-07-14 DIAGNOSIS — Y929 Unspecified place or not applicable: Secondary | ICD-10-CM | POA: Insufficient documentation

## 2017-07-14 MED ORDER — CYCLOBENZAPRINE HCL 5 MG PO TABS: 5.0000 mg | ORAL_TABLET | Freq: Two times a day (BID) | ORAL | 0 refills | Status: DC | PRN

## 2017-07-14 NOTE — ED Provider Notes (Signed)
[facility] EMERGENCY DEPARTMENT Provider Note   CSN: [id number] Arrival date & time: 07/14/17  1444     History   Chief Complaint Chief Complaint  Patient presents with  . Back Pain    HPI Patrick Solomon is a 47 y.o. male.  Pt comes comes in with c/o left sided back pain that has been going on for the last couple of weeks with it worse in the last week. He states that he has had a really bad cough and is not sure if that is causing the increased pain. He was put on an inhaler and prednisone a couple of days ago and symptoms have gotten a little better. No recent fall     Past Medical History:  Diagnosis Date  . Asthma   . Back pain   . Sciatica     There are no active problems to display for this patient.   History reviewed. No pertinent surgical history.      Home Medications    Prior to Admission medications   Medication Sig Start Date End Date Taking? Authorizing Provider  albuterol (PROVENTIL HFA;VENTOLIN HFA) 108 (90 Base) MCG/ACT inhaler Inhale 1-2 puffs into the lungs every 6 (six) hours as needed for wheezing or shortness of breath. 07/10/17   Caccavale, Sophia, PA-C  aspirin-acetaminophen-caffeine (EXCEDRIN MIGRAINE) [phone] MG per tablet Take 2 tablets by mouth every 6 (six) hours as needed for headache.    [provider]  benzonatate (TESSALON) 100 MG capsule Take 1 capsule (100 mg total) by mouth every 8 (eight) hours. 07/10/17   Caccavale, Sophia, PA-C  cyclobenzaprine (FLEXERIL) 10 MG tablet Take 1 tablet (10 mg total) by mouth 3 (three) times daily as needed for muscle spasms. 04/29/16   Shaune Pollack, MD  dicyclomine (BENTYL) 20 MG tablet Take 1 tablet (20 mg total) by mouth 2 (two) times daily. 04/14/14   Loren Racer, MD  Doxylamine-DM & DM 6.25-15 & 15 MG/15ML LQPK Take 30 mLs by mouth every 6 (six) hours as needed (for cough/cold).    [provider]  HYDROcodone-acetaminophen (NORCO) 5-325 MG per tablet Take  1-2 tablets by mouth every 4 (four) hours as needed for severe pain. 04/14/14   Loren Racer, MD  levofloxacin (LEVAQUIN) 750 MG tablet Take 1 tablet (750 mg total) by mouth daily. X 7 days 04/14/14   Loren Racer, MD  methylPREDNISolone (MEDROL DOSEPAK) 4 MG TBPK tablet Taper over 6 days. 11/24/16   Khatri, Hina, PA-C  Multiple Vitamin (ONE-A-DAY MENS PO) Take 1 tablet by mouth daily.    [provider]  ondansetron (ZOFRAN ODT) 4 MG disintegrating tablet 4mg  ODT q4 hours prn nausea/vomit 04/14/14   Loren Racer, MD  predniSONE (DELTASONE) 20 MG tablet Take 2 tablets (40 mg total) by mouth daily for 5 days. 07/10/17 07/15/17  Caccavale, Sophia, PA-C  promethazine-dextromethorphan (PROMETHAZINE-DM) 6.25-15 MG/5ML syrup Take 5 mLs by mouth 4 (four) times daily as needed. 07/10/17   Caccavale, Sophia, PA-C  traMADol (ULTRAM) 50 MG tablet 1 or 2 po q6h prn pain Patient not taking: Reported on 04/14/2014 04/06/13   Ivery Quale, PA-C    Family History History reviewed. No pertinent family history.  Social History Social History   Tobacco Use  . Smoking status: Current Every Day Smoker    Packs/day: 0.50    Types: Cigarettes  . Smokeless tobacco: Never Used  Substance Use Topics  . Alcohol use: Yes    Comment: 1-2 times a month  .  Drug use: No     Allergies   Food   Review of Systems Review of Systems  All other systems reviewed and are negative.    Physical Exam Updated Vital Signs BP 131/90 (BP Location: Right Arm)   Pulse (!) 108   Temp 97.6 F (36.4 C) (Axillary)   Resp 18   Ht 5\' 9"  (1.753 m)   Wt 61.2 kg (135 lb)   SpO2 99%   BMI 19.94 kg/m   Physical Exam  Constitutional: He appears well-developed and well-nourished.  Cardiovascular: Normal rate.  Pulmonary/Chest: Effort normal and breath sounds normal.  Musculoskeletal: Normal range of motion.  Left lumbar paraspinal tenderness  Neurological: He is alert. Coordination normal.  Skin: Skin is warm  and dry.  Nursing note and vitals reviewed.    ED Treatments / Results  Labs (all labs ordered are listed, but only abnormal results are displayed) Labs Reviewed - No data to display  EKG None  Radiology No results found.  Procedures Procedures (including critical care time)  Medications Ordered in ED Medications - No data to display   Initial Impression / Assessment and Plan / ED Course  I have reviewed the triage vital signs and the nursing notes.  Pertinent labs & imaging results that were available during my care of the patient were reviewed by me and considered in my medical decision making (see chart for details).     Will treat with flexeril. Considered pneumonia but nothing heard on exam and pt had xray a couple of days ago and symptoms are improving  Final Clinical Impressions(s) / ED Diagnoses   Final diagnoses:  None    ED Discharge Orders    None       Teressa Lowerickering, Lachell Rochette, NP 07/14/17 1530    Gerhard MunchLockwood, Robert, MD 07/15/17 0002

## 2017-07-14 NOTE — ED Triage Notes (Signed)
Pt presents to eD for assessment of mid back pain with ahx of chronic back pain.  States pain has been pver a week, but now is worsening with movement and he gets sharp "kicks" with movement.  Denies known injury.

## 2017-09-24 ENCOUNTER — Emergency Department (HOSPITAL_COMMUNITY)
Admission: EM | Admit: 2017-09-24 | Discharge: 2017-09-24 | Disposition: A | Discharge status: Home / Self Care | Payer: Self-pay | Attending: Emergency Medicine | Admitting: Emergency Medicine

## 2017-09-24 ENCOUNTER — Encounter (HOSPITAL_COMMUNITY): Payer: Self-pay | Admitting: Emergency Medicine

## 2017-09-24 ENCOUNTER — Other Ambulatory Visit: Payer: Self-pay

## 2017-09-24 DIAGNOSIS — R05 Cough: Secondary | ICD-10-CM

## 2017-09-24 DIAGNOSIS — Z7982 Long term (current) use of aspirin: Secondary | ICD-10-CM | POA: Insufficient documentation

## 2017-09-24 DIAGNOSIS — R059 Cough, unspecified: Secondary | ICD-10-CM

## 2017-09-24 DIAGNOSIS — J45909 Unspecified asthma, uncomplicated: Secondary | ICD-10-CM | POA: Insufficient documentation

## 2017-09-24 DIAGNOSIS — F17210 Nicotine dependence, cigarettes, uncomplicated: Secondary | ICD-10-CM | POA: Insufficient documentation

## 2017-09-24 DIAGNOSIS — Z79899 Other long term (current) drug therapy: Secondary | ICD-10-CM | POA: Insufficient documentation

## 2017-09-24 DIAGNOSIS — J069 Acute upper respiratory infection, unspecified: Secondary | ICD-10-CM | POA: Insufficient documentation

## 2017-09-24 MED ORDER — BENZONATATE 100 MG PO CAPS: 100.0000 mg | ORAL_CAPSULE | Freq: Three times a day (TID) | ORAL | 0 refills | Status: DC

## 2017-09-24 NOTE — ED Triage Notes (Signed)
Cough and cold s/s x 3 days , has taken OTC meds not helping

## 2017-09-24 NOTE — Discharge Instructions (Signed)
You likely have a viral illness.  This should be treated symptomatically. Use Tylenol or ibuprofen as needed for fevers or body aches. Use Flonase daily for nasal congestion and cough. Use cough drops as needed. Make sure you stay well-hydrated with water. Wash your hands frequently to prevent spread of infection. Follow-up as needed if your symptoms are not improving. Return to the emergency room if you develop chest pain, difficulty breathing, or any new or worsening symptoms.

## 2017-09-24 NOTE — ED Provider Notes (Signed)
[facility] Provider Note   CSN: [id number] Arrival date & time: 09/24/17  [id number]     History   Chief Complaint Chief Complaint  Patient presents with  . Cough    HPI Patrick Solomon is a 47 y.o. male presenting for evaluation of cough and nasal congestion.  Patient states that yesterday morning, he started to develop a mild cough.  Since then, he has had persistent nonproductive cough.  He reports associated nasal congestion.  Patient reports sinus pressure/fullness without acute pain.  Cough is worse when he lays flat.  He denies ear pain, sore throat, chest pain, shortness of breath, nausea, or vomiting.  He denies sick contacts.  He is still smoking cigarettes daily.  He has used Robitussin and been drinking tea without improvement of his symptoms.  He denies wheezing.  He states that he piled covers on last night to try and "sweat out the cold," but denies any fevers or chills.  HPI  Past Medical History:  Diagnosis Date  . Asthma   . Back pain   . Sciatica     There are no active problems to display for this patient.   History reviewed. No pertinent surgical history.      Home Medications    Prior to Admission medications   Medication Sig Start Date End Date Taking? Authorizing Provider  albuterol (PROVENTIL HFA;VENTOLIN HFA) 108 (90 Base) MCG/ACT inhaler Inhale 1-2 puffs into the lungs every 6 (six) hours as needed for wheezing or shortness of breath. 07/10/17   Olegario Emberson, PA-C  aspirin-acetaminophen-caffeine (EXCEDRIN MIGRAINE) [phone] MG per tablet Take 2 tablets by mouth every 6 (six) hours as needed for headache.    [provider]  benzonatate (TESSALON) 100 MG capsule Take 1 capsule (100 mg total) by mouth every 8 (eight) hours. 09/24/17   Mitcheal Sweetin, PA-C  cyclobenzaprine (FLEXERIL) 5 MG tablet Take 1 tablet (5 mg total) by mouth 2 (two) times daily as needed for muscle spasms. 07/14/17   Teressa LowerPickering,  Vrinda, NP  dicyclomine (BENTYL) 20 MG tablet Take 1 tablet (20 mg total) by mouth 2 (two) times daily. 04/14/14   Loren RacerYelverton, David, MD  Doxylamine-DM & DM 6.25-15 & 15 MG/15ML LQPK Take 30 mLs by mouth every 6 (six) hours as needed (for cough/cold).    [provider]  HYDROcodone-acetaminophen (NORCO) 5-325 MG per tablet Take 1-2 tablets by mouth every 4 (four) hours as needed for severe pain. 04/14/14   Loren RacerYelverton, David, MD  levofloxacin (LEVAQUIN) 750 MG tablet Take 1 tablet (750 mg total) by mouth daily. X 7 days 04/14/14   Loren RacerYelverton, David, MD  methylPREDNISolone (MEDROL DOSEPAK) 4 MG TBPK tablet Taper over 6 days. 11/24/16   Khatri, Hina, PA-C  Multiple Vitamin (ONE-A-DAY MENS PO) Take 1 tablet by mouth daily.    [provider]  ondansetron (ZOFRAN ODT) 4 MG disintegrating tablet 4mg  ODT q4 hours prn nausea/vomit 04/14/14   Loren RacerYelverton, David, MD  promethazine-dextromethorphan (PROMETHAZINE-DM) 6.25-15 MG/5ML syrup Take 5 mLs by mouth 4 (four) times daily as needed. 07/10/17   Onis Markoff, PA-C  traMADol (ULTRAM) 50 MG tablet 1 or 2 po q6h prn pain Patient not taking: Reported on 04/14/2014 04/06/13   Ivery QualeBryant, Hobson, PA-C    Family History No family history on file.  Social History Social History   Tobacco Use  . Smoking status: Current Every Day Smoker    Packs/day: 0.50    Types: Cigarettes  . Smokeless tobacco: Never  Used  Substance Use Topics  . Alcohol use: Yes    Comment: 1-2 times a month  . Drug use: No     Allergies   Food   Review of Systems Review of Systems  Constitutional: Negative for chills and fever.  HENT: Positive for congestion and sinus pressure. Negative for sore throat.   Respiratory: Positive for cough. Negative for shortness of breath.   Cardiovascular: Negative for chest pain.     Physical Exam Updated Vital Signs BP 129/87 (BP Location: Right Arm)   Pulse 89   Temp 98.5 F (36.9 C) (Oral)   Resp 18   Ht 5\' 9"  (1.753 m)   Wt  61.2 kg   SpO2 97%   BMI 19.94 kg/m   Physical Exam  Constitutional: He is oriented to person, place, and time. He appears well-developed and well-nourished. No distress.  In no acute distress  HENT:  Head: Normocephalic and atraumatic.  Right Ear: Tympanic membrane, external ear and ear canal normal.  Left Ear: Tympanic membrane, external ear and ear canal normal.  Nose: Mucosal edema present. Right sinus exhibits no maxillary sinus tenderness and no frontal sinus tenderness. Left sinus exhibits no maxillary sinus tenderness and no frontal sinus tenderness.  Mouth/Throat: Uvula is midline, oropharynx is clear and moist and mucous membranes are normal.  Nasal mucosal edema.  OP clear without tonsillar swelling or exudate.  Uvula midline with equal palate rise.  TMs nonerythematous and nonbulging bilaterally.  Eyes: Pupils are equal, round, and reactive to light. Conjunctivae and EOM are normal.  Neck: Normal range of motion. Neck supple.  Cardiovascular: Normal rate, regular rhythm and intact distal pulses.  Pulmonary/Chest: Effort normal and breath sounds normal. No respiratory distress. He has no wheezes.  Speaking full sentences.  Clear lung sounds in all fields.  Abdominal: Soft. He exhibits no distension and no mass. There is no tenderness. There is no guarding.  Musculoskeletal: Normal range of motion.  Neurological: He is alert and oriented to person, place, and time.  Skin: Skin is warm and dry. Capillary refill takes less than 2 seconds.  Psychiatric: He has a normal mood and affect.  Nursing note and vitals reviewed.    ED Treatments / Results  Labs (all labs ordered are listed, but only abnormal results are displayed) Labs Reviewed - No data to display  EKG None  Radiology No results found.  Procedures Procedures (including critical care time)  Medications Ordered in ED Medications - No data to display   Initial Impression / Assessment and Plan / ED Course    I have reviewed the triage vital signs and the nursing notes.  Pertinent labs & imaging results that were available during my care of the patient were reviewed by me and considered in my medical decision making (see chart for details).     Patient presenting for evaluation of 1 day history of nasal congestion and cough.  Physical exam reassuring, patient is afebrile and appears nontoxic.  Pulmonary exam reassuring.  Doubt pneumonia, strep, other bacterial infection, or peritonsillar abscess. Likely viral URI.  Will treat symptomatically.  Patient to follow-up as needed.  At this time, patient appears safe for discharge.  Return precautions given.  Patient states he understands and agrees to plan.  Final Clinical Impressions(s) / ED Diagnoses   Final diagnoses:  Cough  Upper respiratory tract infection, unspecified type    ED Discharge Orders         Ordered    benzonatate (  TESSALON) 100 MG capsule  Every 8 hours     09/24/17 0758           Alveria Apley, PA-C 09/24/17 0805    Rolan Bucco, MD 09/24/17 [phone]

## 2017-12-13 ENCOUNTER — Ambulatory Visit: Payer: Self-pay | Admitting: Internal Medicine

## 2017-12-13 ENCOUNTER — Encounter: Payer: Self-pay | Admitting: Internal Medicine

## 2017-12-13 VITALS — BP 130/88 | HR 82 | Resp 12 | Ht 68.0 in | Wt 133.0 lb

## 2017-12-13 DIAGNOSIS — K047 Periapical abscess without sinus: Secondary | ICD-10-CM

## 2017-12-13 MED ORDER — PENICILLIN V POTASSIUM 500 MG PO TABS: 500.0000 mg | ORAL_TABLET | Freq: Four times a day (QID) | ORAL | 0 refills | Status: DC

## 2017-12-13 MED ORDER — HYDROCODONE-ACETAMINOPHEN 5-325 MG PO TABS: 1.0000 | ORAL_TABLET | Freq: Four times a day (QID) | ORAL | 0 refills | Status: DC | PRN

## 2017-12-13 NOTE — Progress Notes (Signed)
Subjective:    Patient ID: Patrick Solomon, male    DOB: 1970/11/21, 47 y.o.   MRN: [id number]  HPI   Here to establish--walked in with pain  Had a bad tooth pulled 2 days ago with a [facility] dental clinic in another town.  He states the tooth was broken and had a deep root. He states the pain has worsened since the tooth was pulled.  He did not receive any antibiotics. This was a left mandibular molar.  Has been taking Ibuprofen 1200 mg every 2 hours.  Vomited this morning.  No blood.  No melena or hematochezia. Has had a 24 oz beer in past 24 hours per patient.    No outpatient medications have been marked as taking for the 12/13/17 encounter (Office Visit) with Julieanne Manson, MD.    Allergies  Allergen Reactions  . Food     Pickles/Peanuts/Tuna Fish---Rash/Spots on body   Past Medical History:  Diagnosis Date  . Asthma   . Back pain   . Sciatica    No past surgical history on file.   Family History  Problem Relation Age of Onset  . Alcohol abuse Mother   . Cirrhosis Mother   . Cancer Mother        unknown type cancer  . Hypertension Father     Social History   Socioeconomic History  . Marital status: Single    Spouse name: Not on file  . Number of children: 12  . Years of education: 25  . Highest education level: High school graduate  Occupational History  . Not on file  Social Needs  . Financial resource strain: Not hard at all  . Food insecurity:    Worry: Not on file    Inability: Not on file  . Transportation needs:    Medical: Not on file    Non-medical: Not on file  Tobacco Use  . Smoking status: Current Every Day Smoker    Packs/day: 0.50    Years: 29.00    Pack years: 14.50    Types: Cigarettes  . Smokeless tobacco: Never Used  . Tobacco comment: Information for Quitline given  Substance and Sexual Activity  . Alcohol use: Yes    Comment: States drinks once weekly on weekend-beer  . Drug use: No  . Sexual  activity: Not on file  Lifestyle  . Physical activity:    Days per week: Not on file    Minutes per session: Not on file  . Stress: Not on file  Relationships  . Social connections:    Talks on phone: Not on file    Gets together: Not on file    Attends religious service: Not on file    Active member of club or organization: Not on file    Attends meetings of clubs or organizations: Not on file    Relationship status: Not on file  . Intimate partner violence:    Fear of current or ex partner: Not on file    Emotionally abused: Not on file    Physically abused: Not on file    Forced sexual activity: Not on file  Other Topics Concern  . Not on file  Social History Narrative  . Not on file    Review of Systems     Objective:   Physical Exam Holding left face Smells of alcohol HEENT:  PERRL, EOMI, TMs pearly gray, throat without injection.  caried blackened piece of tooth buried in swollen gingiva --  most posterior molar. No fluctuance of surrounding gingiva.  Tender Significant tartar around lower incisors with blackening underneath.  Gingival recession. Neck:  Supple, No adenopathy Chest:  CTA CV:  RRR without murmur or rub.  Radial pulses normal and equal Abd:  S, NT, No HSM or mass, + BS      Assessment & Plan:  Abscessed tooth:  All of tooth not removed.  Penicillin 500 mg 4 times daily for 7 days.   Hydrocodone 5/325 mg 10 tabs given.  Then to return to ibuprofen 600-800 mg only and to leave 6 hours in between dosing. Note for work today Referral in [address] to [facility] to check in for any other needs [employer] packet--send referral to [employer], but on hold for now and discussed will be a delay.

## 2018-01-08 ENCOUNTER — Telehealth: Payer: Self-pay | Admitting: Internal Medicine

## 2018-01-08 ENCOUNTER — Ambulatory Visit (INDEPENDENT_AMBULATORY_CARE_PROVIDER_SITE_OTHER): Payer: Self-pay | Admitting: Physician Assistant

## 2018-01-08 NOTE — Telephone Encounter (Signed)
To Dr. Mulberry for further directions 

## 2018-01-08 NOTE — Telephone Encounter (Signed)
Patient called requesting a release letter from Dr. Delrae AlfredMulberry where states patient is done with antibiotics to be able to donate blood.  Please advise.

## 2018-01-10 NOTE — Telephone Encounter (Signed)
Pt. Came in to pick up letter

## 2018-01-10 NOTE — Telephone Encounter (Signed)
Should have finished Penicillin on 12/20/17

## 2018-03-08 ENCOUNTER — Encounter: Payer: Self-pay | Admitting: Internal Medicine

## 2018-03-17 ENCOUNTER — Encounter: Payer: Self-pay | Admitting: Internal Medicine

## 2018-07-28 ENCOUNTER — Ambulatory Visit (INDEPENDENT_AMBULATORY_CARE_PROVIDER_SITE_OTHER): Payer: Self-pay | Admitting: Primary Care

## 2018-08-01 ENCOUNTER — Ambulatory Visit: Payer: Self-pay | Admitting: Primary Care

## 2018-08-12 ENCOUNTER — Ambulatory Visit (INDEPENDENT_AMBULATORY_CARE_PROVIDER_SITE_OTHER): Payer: Self-pay | Admitting: Primary Care

## 2018-10-27 IMAGING — DX DG CHEST 2V
2 series · 2 of 2 positions shown · non-contrast
Comparison: 04/14/2014

CLINICAL DATA: Left side chest pain, injury lifting furniture 2
days ago

EXAM:
CHEST  2 VIEW

[chest pa]
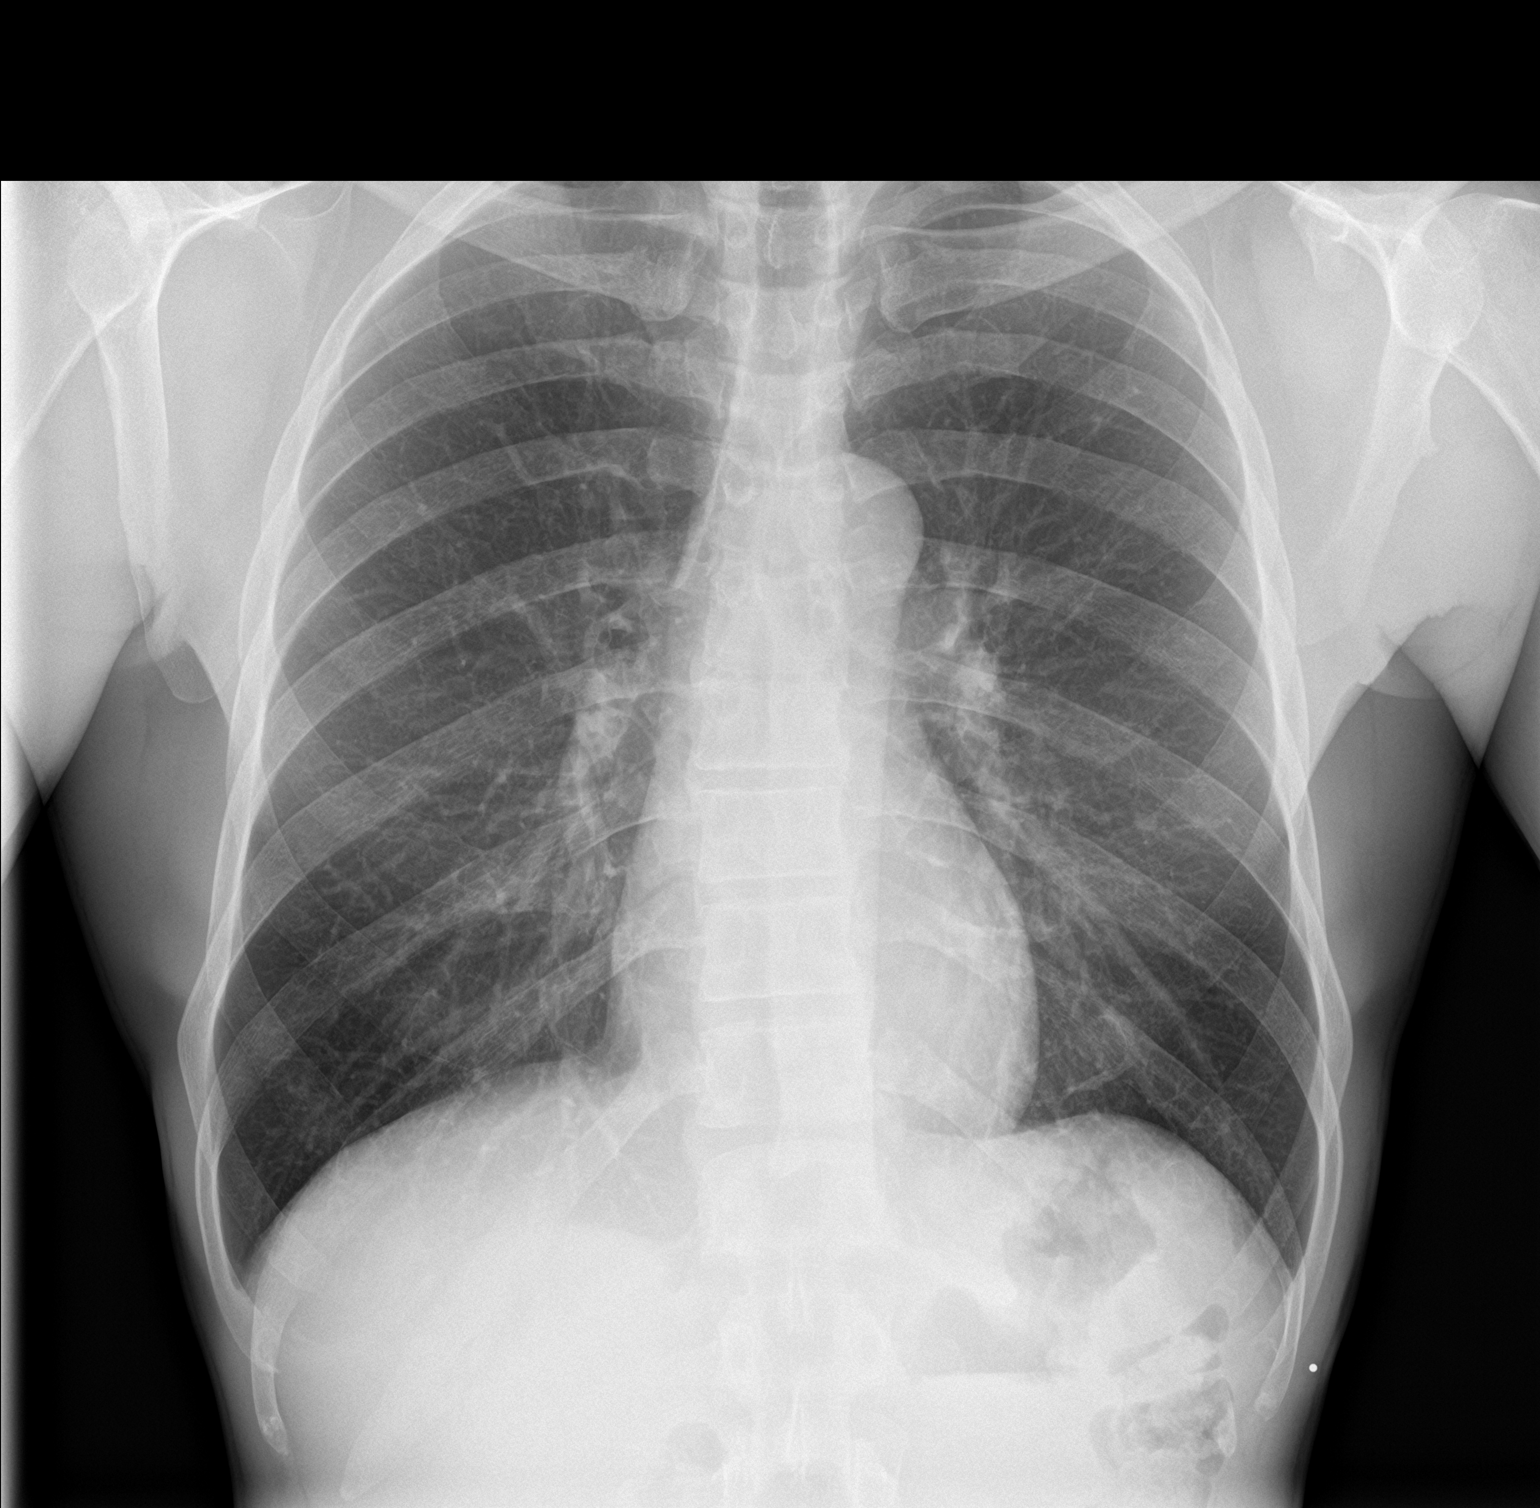

[chest lat]
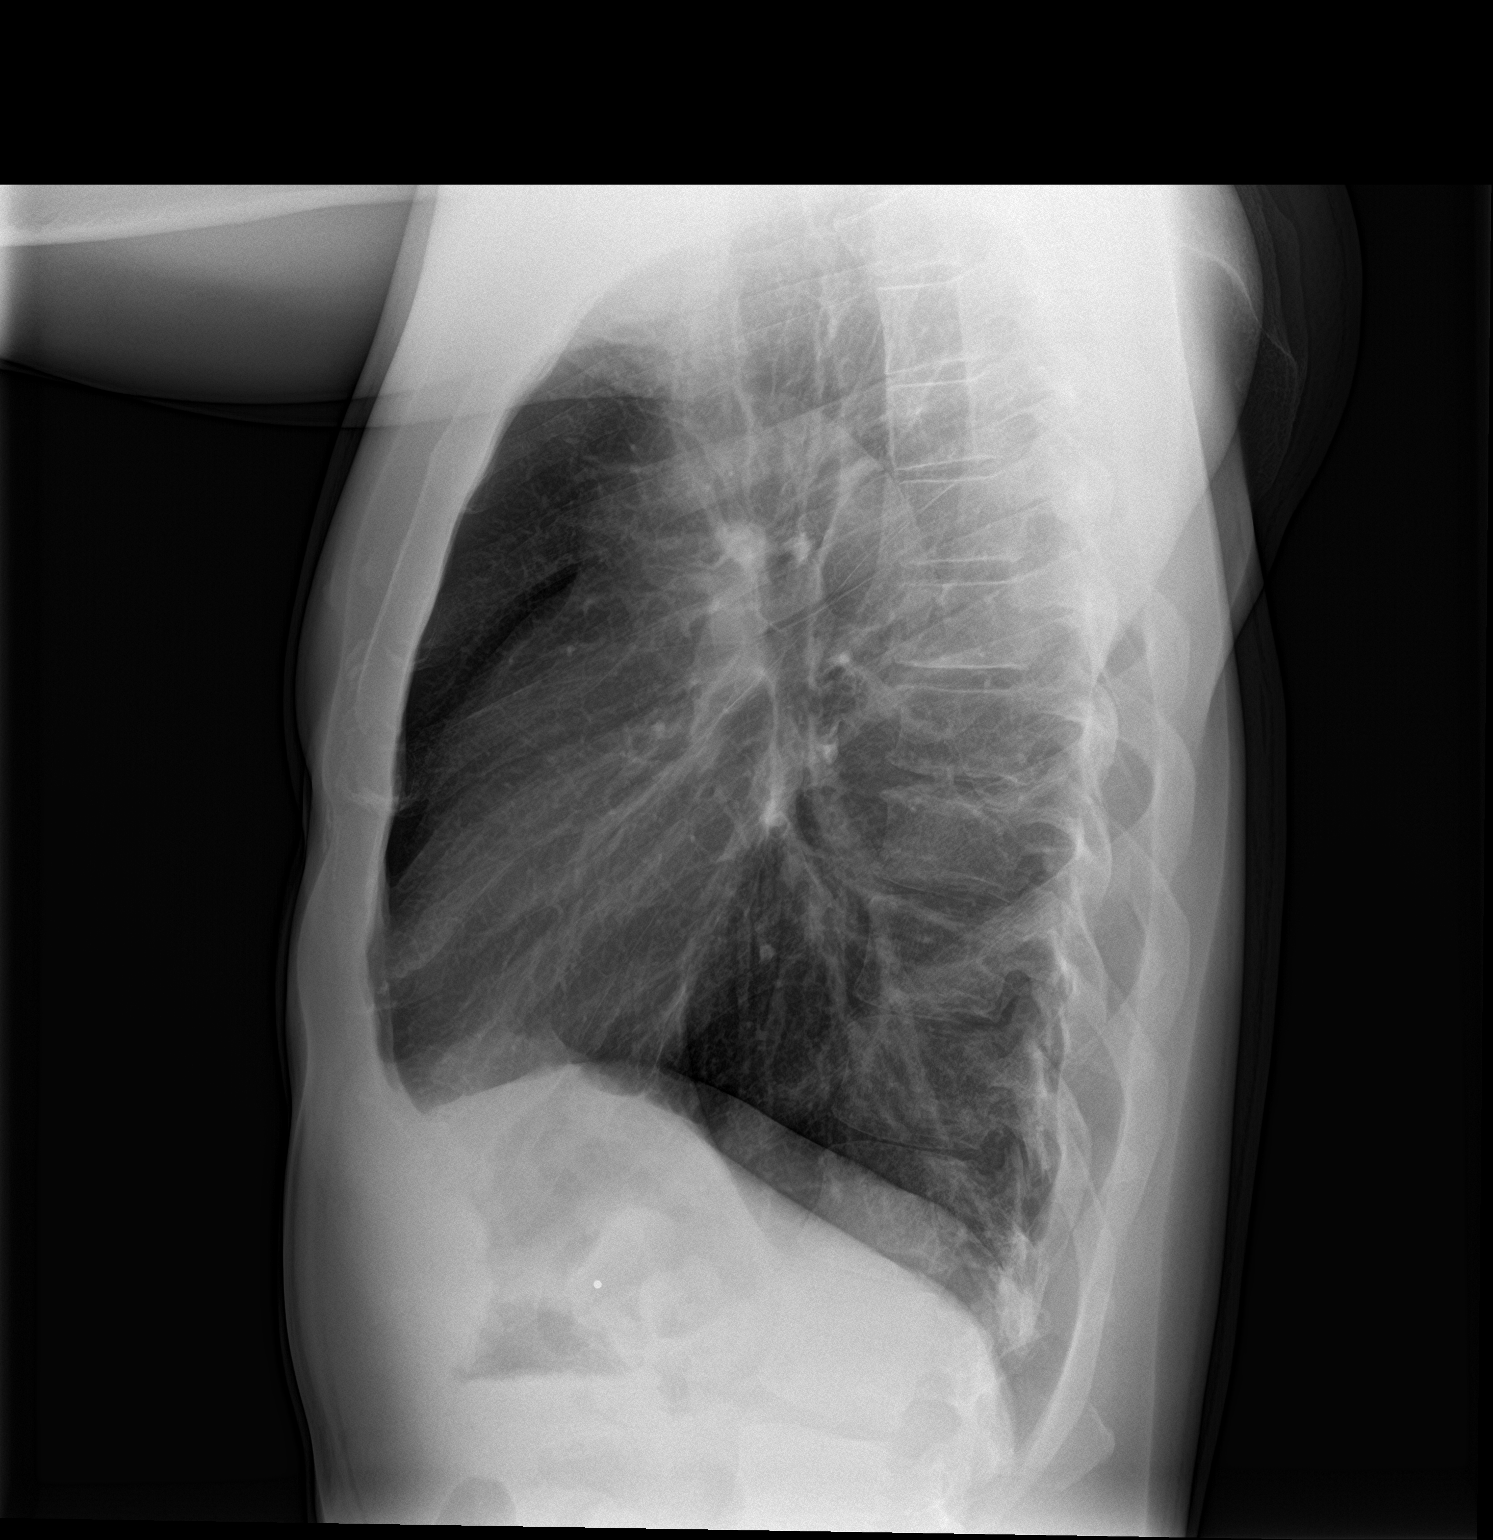

[2 of 2 positions shown; findings below may reference images not displayed]

FINDINGS: The heart size and mediastinal contours are within normal limits.
Both lungs are clear. The visualized skeletal structures are
unremarkable. No pneumothorax.
IMPRESSION: No active cardiopulmonary disease.

## 2019-04-13 ENCOUNTER — Other Ambulatory Visit: Payer: Self-pay

## 2019-04-13 ENCOUNTER — Encounter (INDEPENDENT_AMBULATORY_CARE_PROVIDER_SITE_OTHER): Payer: Self-pay | Admitting: Primary Care

## 2019-04-13 ENCOUNTER — Ambulatory Visit (INDEPENDENT_AMBULATORY_CARE_PROVIDER_SITE_OTHER): Payer: Self-pay | Admitting: Primary Care

## 2019-04-13 VITALS — BP 153/97 | HR 76 | Ht 70.0 in | Wt 142.6 lb

## 2019-04-13 DIAGNOSIS — G4733 Obstructive sleep apnea (adult) (pediatric): Secondary | ICD-10-CM

## 2019-04-13 DIAGNOSIS — Z1211 Encounter for screening for malignant neoplasm of colon: Secondary | ICD-10-CM

## 2019-04-13 DIAGNOSIS — M5440 Lumbago with sciatica, unspecified side: Secondary | ICD-10-CM

## 2019-04-13 DIAGNOSIS — Z114 Encounter for screening for human immunodeficiency virus [HIV]: Secondary | ICD-10-CM

## 2019-04-13 DIAGNOSIS — Z720 Tobacco use: Secondary | ICD-10-CM

## 2019-04-13 DIAGNOSIS — Z7689 Persons encountering health services in other specified circumstances: Secondary | ICD-10-CM

## 2019-04-13 DIAGNOSIS — R030 Elevated blood-pressure reading, without diagnosis of hypertension: Secondary | ICD-10-CM

## 2019-04-13 DIAGNOSIS — Z23 Encounter for immunization: Secondary | ICD-10-CM

## 2019-04-13 DIAGNOSIS — Z125 Encounter for screening for malignant neoplasm of prostate: Secondary | ICD-10-CM

## 2019-04-13 MED ORDER — NAPROXEN 500 MG PO TABS: 500.0000 mg | ORAL_TABLET | Freq: Two times a day (BID) | ORAL | 1 refills | Status: DC

## 2019-04-13 MED FILL — NAPROXEN 500 MG TABLET: 500 | 45 days supply | Qty: 90 | Fill #0

## 2019-04-13 NOTE — Progress Notes (Signed)
Back pain - hurts to change positions

## 2019-04-13 NOTE — Progress Notes (Signed)
New Patient Office Visit  Subjective:  Patient ID: Patrick Solomon, male    DOB: 09-Apr-1970  Age: 49 y.o. MRN: [id number]  CC:  Chief Complaint  Patient presents with  . New Patient (Initial Visit)  . Sciatica  . Sleep Apnea    HPI Patrick Solomon presents for establishment of care . He voices chronic back pain for 10 years. Family member state he snores and stops breathing.  Past Medical History:  Diagnosis Date  . Asthma   . Back pain   . Sciatica     History reviewed. No pertinent surgical history.  Family History  Problem Relation Age of Onset  . Alcohol abuse Mother   . Cirrhosis Mother   . Cancer Mother        unknown type cancer  . Hypertension Father     Social History   Socioeconomic History  . Marital status: Single    Spouse name: Not on file  . Number of children: 12  . Years of education: 29  . Highest education level: High school graduate  Occupational History  . Not on file  Tobacco Use  . Smoking status: Current Every Day Smoker    Packs/day: 0.50    Years: 29.00    Pack years: 14.50    Types: Cigarettes  . Smokeless tobacco: Never Used  . Tobacco comment: Information for Quitline given  Substance and Sexual Activity  . Alcohol use: Yes    Comment: States drinks once weekly on weekend-beer  . Drug use: No  . Sexual activity: Not on file  Other Topics Concern  . Not on file  Social History Narrative  . Not on file   Social Determinants of Health   Financial Resource Strain:   . Difficulty of Paying Living Expenses: Not on file  Food Insecurity:   . Worried About Charity fundraiser in the Last Year: Not on file  . Ran Out of Food in the Last Year: Not on file  Transportation Needs:   . Lack of Transportation (Medical): Not on file  . Lack of Transportation (Non-Medical): Not on file  Physical Activity:   . Days of Exercise per Week: Not on file  . Minutes of Exercise per Session: Not on file  Stress:   . Feeling of Stress :  Not on file  Social Connections:   . Frequency of Communication with Friends and Family: Not on file  . Frequency of Social Gatherings with Friends and Family: Not on file  . Attends Religious Services: Not on file  . Active Member of Clubs or Organizations: Not on file  . Attends Archivist Meetings: Not on file  . Marital Status: Not on file  Intimate Partner Violence:   . Fear of Current or Ex-Partner: Not on file  . Emotionally Abused: Not on file  . Physically Abused: Not on file  . Sexually Abused: Not on file    ROS Review of Systems  Respiratory:       Snoring and sleep apnea  Genitourinary:       Hestency  Musculoskeletal: Positive for back pain.  All other systems reviewed and are negative.   Objective:   Today's Vitals: BP (!) 153/97 (BP Location: Right Arm, Patient Position: Sitting, Cuff Size: Normal)   Pulse 76   Ht 5\' 10"  (1.778 m)   Wt 142 lb 9.6 oz (64.7 kg)   SpO2 99%   BMI 20.46 kg/m   Physical Exam Vitals reviewed.  Constitutional:      Appearance: Normal appearance. He is normal weight.  HENT:     Head: Normocephalic.  Eyes:     Extraocular Movements: Extraocular movements intact.     Pupils: Pupils are equal, round, and reactive to light.  Cardiovascular:     Rate and Rhythm: Normal rate and regular rhythm.  Pulmonary:     Effort: Pulmonary effort is normal.     Breath sounds: Normal breath sounds.  Abdominal:     General: Abdomen is flat. Bowel sounds are normal.  Musculoskeletal:        General: Normal range of motion.     Cervical back: Normal range of motion.  Skin:    General: Skin is warm and dry.  Neurological:     Mental Status: He is alert and oriented to person, place, and time.  Psychiatric:        Mood and Affect: Mood normal.     Assessment & Plan:  Sullivan was seen today for new patient (initial visit), sciatica and sleep apnea.  Diagnoses and all orders for this visit:  Encounter to establish  care Gwinda Passe, NP-C will be your  (PCP) she is mastered prepared . She is skilled to diagnosed and treat illness. Also able to answer health concern as well as continuing care of varied medical conditions, not limited by cause, organ system, or diagnosis.  -     CBC with Differential -     Comprehensive metabolic panel -     Lipid Panel  Tobacco abuse Aware of  increased risk for lung cancer and other respiratory diseases recommend cessation.  This will be reminded at each clinical visit.    Elevated blood pressure reading without diagnosis of hypertension Monitor sodium, snacks, fast food   Prostate cancer screening -     PSA  Colon cancer screening -     Fecal occult blood, imunochemical; Future  Encounter for screening for HIV -     HIV Antibody (routine testing w rflx)  Low back pain with sciatica, sciatica laterality unspecified, unspecified back pain laterality, unspecified chronicity BACK PAIN  Location: bilateral and middle and shoulder blabes Quality sharp pain at times unable to move neck Onset: constantly  Worse with: lifting     Better with: Epson salt baths  Radiation: spatic  Trauma: no 2011/07/2009 in prison did death lifts stayed on work board 2 months - tx with medication no x-rays  Best sitting/standing/leaning forward: yes  Red Flags Fecal/urinary incontinence: no Numbness/Weakness: no Fever/chills/sweats: no Night pain: no Unexplained weight loss: no No relief with bedrest: yes  OSA (obstructive sleep apnea) Patient presents with possible obstructive sleep apnea. Patent has a history of symptoms of snoring and sleep apnea. Patient generally gets 8 hours of sleep per night, and states they generally have poor quality  . Snoring of moderate severity present. Apneic episodes are present.   Outpatient Encounter Medications as of 04/13/2019  Medication Sig  . DULoxetine (CYMBALTA) 30 MG capsule Take 30 mg by mouth daily.  . naproxen (NAPROSYN) 500 MG  tablet Take 1 tablet (500 mg total) by mouth 2 (two) times daily with a meal.  . [DISCONTINUED] HYDROcodone-acetaminophen (NORCO/VICODIN) 5-325 MG tablet Take 1 tablet by mouth every 6 (six) hours as needed for moderate pain.  . [DISCONTINUED] penicillin v potassium (VEETID) 500 MG tablet Take 1 tablet (500 mg total) by mouth 4 (four) times daily.   No facility-administered encounter medications on file as of 04/13/2019.  Follow-up: Return in about 6 weeks (around 05/25/2019) for BP in person follow up .   Grayce Sessions, NP

## 2019-04-13 NOTE — Patient Instructions (Addendum)
Sleep Apnea Sleep apnea affects breathing during sleep. It causes breathing to stop for a short time or to become shallow. It can also increase the risk of:  Heart attack.  Stroke.  Being very overweight (obese).  Diabetes.  Heart failure.  Irregular heartbeat. The goal of treatment is to help you breathe normally again. What are the causes? There are three kinds of sleep apnea:  Obstructive sleep apnea. This is caused by a blocked or collapsed airway.  Central sleep apnea. This happens when the brain does not send the right signals to the muscles that control breathing.  Mixed sleep apnea. This is a combination of obstructive and central sleep apnea. The most common cause of this condition is a collapsed or blocked airway. This can happen if:  Your throat muscles are too relaxed.  Your tongue and tonsils are too large.  You are overweight.  Your airway is too small. What increases the risk?  Being overweight.  Smoking.  Having a small airway.  Being older.  Being male.  Drinking alcohol.  Taking medicines to calm yourself (sedatives or tranquilizers).  Having family members with the condition. What are the signs or symptoms?  Trouble staying asleep.  Being sleepy or tired during the day.  Getting angry a lot.  Loud snoring.  Headaches in the morning.  Not being able to focus your mind (concentrate).  Forgetting things.  Less interest in sex.  Mood swings.  Personality changes.  Feelings of sadness (depression).  Waking up a lot during the night to pee (urinate).  Dry mouth.  Sore throat. How is this diagnosed?  Your medical history.  A physical exam.  A test that is done when you are sleeping (sleep study). The test is most often done in a sleep lab but may also be done at home. How is this treated?   Sleeping on your side.  Using a medicine to get rid of mucus in your nose (decongestant).  Avoiding the use of alcohol,  medicines to help you relax, or certain pain medicines (narcotics).  Losing weight, if needed.  Changing your diet.  Not smoking.  Using a machine to open your airway while you sleep, such as: ? An oral appliance. This is a mouthpiece that shifts your lower jaw forward. ? A CPAP device. This device blows air through a mask when you breathe out (exhale). ? An EPAP device. This has valves that you put in each nostril. ? A BPAP device. This device blows air through a mask when you breathe in (inhale) and breathe out.  Having surgery if other treatments do not work. It is important to get treatment for sleep apnea. Without treatment, it can lead to:  High blood pressure.  Coronary artery disease.  In men, not being able to have an erection (impotence).  Reduced thinking ability. Follow these instructions at home: Lifestyle  Make changes that your doctor recommends.  Eat a healthy diet.  Lose weight if needed.  Avoid alcohol, medicines to help you relax, and some pain medicines.  Do not use any products that contain nicotine or tobacco, such as cigarettes, e-cigarettes, and chewing tobacco. If you need help quitting, ask your doctor. General instructions  Take over-the-counter and prescription medicines only as told by your doctor.  If you were given a machine to use while you sleep, use it only as told by your doctor.  If you are having surgery, make sure to tell your doctor you have sleep apnea. You   may need to bring your device with you.  Keep all follow-up visits as told by your doctor. This is important. Contact a doctor if:  The machine that you were given to use during sleep bothers you or does not seem to be working.  You do not get better.  You get worse. Get help right away if:  Your chest hurts.  You have trouble breathing in enough air.  You have an uncomfortable feeling in your back, arms, or stomach.  You have trouble talking.  One side of your  body feels weak.  A part of your face is hanging down. These symptoms may be an emergency. Do not wait to see if the symptoms will go away. Get medical help right away. Call your local emergency services (911 in the [place] Do not drive yourself to the hospital. Summary  This condition affects breathing during sleep.  The most common cause is a collapsed or blocked airway.  The goal of treatment is to help you breathe normally while you sleep. This information is not intended to replace advice given to you by your health care provider. Make sure you discuss any questions you have with your health care provider. Document Revised: 11/08/2017 Document Reviewed: 09/17/2017 Elsevier Patient Education  2020 [employer]. https://www.cdc.gov/vaccines/hcp/vis/vis-statements/tdap.pdf">  Tdap (Tetanus, Diphtheria, Pertussis) Vaccine: What You Need to Know 1. Why get vaccinated? Tdap vaccine can prevent tetanus, diphtheria, and pertussis. Diphtheria and pertussis spread from person to person. Tetanus enters the body through cuts or wounds.  TETANUS (T) causes painful stiffening of the muscles. Tetanus can lead to serious health problems, including being unable to open the mouth, having trouble swallowing and breathing, or death.  DIPHTHERIA (D) can lead to difficulty breathing, heart failure, paralysis, or death.  PERTUSSIS (aP), also known as "whooping cough," can cause uncontrollable, violent coughing which makes it hard to breathe, eat, or drink. Pertussis can be extremely serious in babies and young children, causing pneumonia, convulsions, brain damage, or death. In teens and adults, it can cause weight loss, loss of bladder control, passing out, and rib fractures from severe coughing. 2. Tdap vaccine Tdap is only for children 7 years and older, adolescents, and adults.  Adolescents should receive a single dose of Tdap, preferably at age 42 or 12 years. Pregnant women should get a dose of Tdap  during every pregnancy, to protect the newborn from pertussis. Infants are most at risk for severe, life-threatening complications from pertussis. Adults who have never received Tdap should get a dose of Tdap. Also, adults should receive a booster dose every 10 years, or earlier in the case of a severe and dirty wound or burn. Booster doses can be either Tdap or Td (a different vaccine that protects against tetanus and diphtheria but not pertussis). Tdap may be given at the same time as other vaccines. 3. Talk with your health care provider Tell your vaccine provider if the person getting the vaccine:  Has had an allergic reaction after a previous dose of any vaccine that protects against tetanus, diphtheria, or pertussis, or has any severe, life-threatening allergies.  Has had a coma, decreased level of consciousness, or prolonged seizures within 7 days after a previous dose of any pertussis vaccine (DTP, DTaP, or Tdap).  Has seizures or another nervous system problem.  Has ever had Guillain-Barr Syndrome (also called GBS).  Has had severe pain or swelling after a previous dose of any vaccine that protects against tetanus or diphtheria. In some cases, your  health care provider may decide to postpone Tdap vaccination to a future visit.  People with minor illnesses, such as a cold, may be vaccinated. People who are moderately or severely ill should usually wait until they recover before getting Tdap vaccine.  Your health care provider can give you more information. 4. Risks of a vaccine reaction  Pain, redness, or swelling where the shot was given, mild fever, headache, feeling tired, and nausea, vomiting, diarrhea, or stomachache sometimes happen after Tdap vaccine. People sometimes faint after medical procedures, including vaccination. Tell your provider if you feel dizzy or have vision changes or ringing in the ears.  As with any medicine, there is a very remote chance of a vaccine causing  a severe allergic reaction, other serious injury, or death. 5. What if there is a serious problem? An allergic reaction could occur after the vaccinated person leaves the clinic. If you see signs of a severe allergic reaction (hives, swelling of the face and throat, difficulty breathing, a fast heartbeat, dizziness, or weakness), call 9-1-1 and get the person to the nearest hospital. For other signs that concern you, call your health care provider.  Adverse reactions should be reported to the Vaccine Adverse Event Reporting System (VAERS). Your health care provider will usually file this report, or you can do it yourself. Visit the VAERS website at www.vaers.SamedayNews.es or call 312-129-0922. VAERS is only for reporting reactions, and VAERS staff do not give medical advice. 6. The National Vaccine Injury Compensation Program The [employer] Vaccine Injury Compensation Program (VICP) is a federal program that was created to compensate people who may have been injured by certain vaccines. Visit the VICP website at GoldCloset.com.ee or call (419)608-1871 to learn about the program and about filing a claim. There is a time limit to file a claim for compensation. 7. How can I learn more?  Ask your health care provider.  Call your local or state health department.  Contact the Centers for Disease Control and Prevention (CDC): ? Call 204-087-2115 (1-800-CDC-INFO) or ? Visit CDC's website at http://hunter.com/ Vaccine Information Statement Tdap (Tetanus, Diphtheria, Pertussis) Vaccine (05/07/2018) This information is not intended to replace advice given to you by your health care provider. Make sure you discuss any questions you have with your health care provider. Document Revised: 05/16/2018 Document Reviewed: 05/19/2018 Elsevier Patient Education  [place].

## 2019-04-14 LAB — CBC WITH DIFFERENTIAL/PLATELET
Basophils Absolute: 0 10*3/uL (ref 0.0–0.2)
Basos: 1 %
EOS (ABSOLUTE): 0.1 10*3/uL (ref 0.0–0.4)
Eos: 1 %
Hematocrit: 44 % (ref 37.5–51.0)
Hemoglobin: 15.2 g/dL (ref 13.0–17.7)
Immature Grans (Abs): 0 10*3/uL (ref 0.0–0.1)
Immature Granulocytes: 0 %
Lymphocytes Absolute: 1.5 10*3/uL (ref 0.7–3.1)
Lymphs: 27 %
MCH: 30 pg (ref 26.6–33.0)
MCHC: 34.5 g/dL (ref 31.5–35.7)
MCV: 87 fL (ref 79–97)
Monocytes Absolute: 0.9 10*3/uL (ref 0.1–0.9)
Monocytes: 16 %
Neutrophils Absolute: 3.1 10*3/uL (ref 1.4–7.0)
Neutrophils: 55 %
Platelets: 221 10*3/uL (ref 150–450)
RBC: 5.06 x10E6/uL (ref 4.14–5.80)
RDW: 12.7 % (ref 11.6–15.4)
WBC: 5.6 10*3/uL (ref 3.4–10.8)

## 2019-04-14 LAB — LIPID PANEL
Chol/HDL Ratio: 2.1 ratio (ref 0.0–5.0)
Cholesterol, Total: 164 mg/dL (ref 100–199)
HDL: 79 mg/dL (ref >39)
LDL Chol Calc (NIH): 72 mg/dL (ref 0–99)
Triglycerides: 67 mg/dL (ref 0–149)
VLDL Cholesterol Cal: 13 mg/dL (ref 5–40)

## 2019-04-14 LAB — COMPREHENSIVE METABOLIC PANEL
ALT: 14 IU/L (ref 0–44)
AST: 24 IU/L (ref 0–40)
Albumin/Globulin Ratio: 1.8 (ref 1.2–2.2)
Albumin: 4.2 g/dL (ref 4.0–5.0)
Alkaline Phosphatase: 56 IU/L (ref 39–117)
BUN/Creatinine Ratio: 13 (ref 9–20)
BUN: 13 mg/dL (ref 6–24)
Bilirubin Total: 0.8 mg/dL (ref 0.0–1.2)
CO2: 23 mmol/L (ref 20–29)
Calcium: 9.2 mg/dL (ref 8.7–10.2)
Chloride: 105 mmol/L (ref 96–106)
Creatinine, Ser: 0.97 mg/dL (ref 0.76–1.27)
GFR calc Af Amer: 105 mL/min/{1.73_m2} (ref >59)
GFR calc non Af Amer: 91 mL/min/{1.73_m2} (ref >59)
Globulin, Total: 2.4 g/dL (ref 1.5–4.5)
Glucose: 86 mg/dL (ref 65–99)
Potassium: 4.5 mmol/L (ref 3.5–5.2)
Sodium: 141 mmol/L (ref 134–144)
Total Protein: 6.6 g/dL (ref 6.0–8.5)

## 2019-04-14 LAB — HIV ANTIBODY (ROUTINE TESTING W REFLEX): HIV Screen 4th Generation wRfx: NONREACTIVE

## 2019-04-14 LAB — PSA: Prostate Specific Ag, Serum: 0.7 ng/mL (ref 0.0–4.0)

## 2019-04-15 LAB — FECAL OCCULT BLOOD, IMMUNOCHEMICAL: Fecal Occult Bld: NEGATIVE

## 2019-04-16 ENCOUNTER — Telehealth (INDEPENDENT_AMBULATORY_CARE_PROVIDER_SITE_OTHER): Payer: Self-pay

## 2019-04-16 NOTE — Telephone Encounter (Signed)
Patient verified date of birth. He is aware that labs are normal, HIV and FIT negative. Maryjean Morn, CMA

## 2019-04-16 NOTE — Telephone Encounter (Signed)
-----   Message from Grayce Sessions, NP sent at 04/15/2019  5:36 PM EST ----- All labs normal -negative for colon cancer screening re check in 1 year. PSA normal and HIV negative

## 2019-05-11 ENCOUNTER — Other Ambulatory Visit: Payer: Self-pay

## 2019-05-11 ENCOUNTER — Ambulatory Visit (HOSPITAL_COMMUNITY)
Admission: EM | Admit: 2019-05-11 | Discharge: 2019-05-11 | Disposition: A | Discharge status: Home / Self Care | Payer: Self-pay | Attending: Family Medicine | Admitting: Family Medicine

## 2019-05-11 ENCOUNTER — Encounter (HOSPITAL_COMMUNITY): Payer: Self-pay | Admitting: Emergency Medicine

## 2019-05-11 DIAGNOSIS — M5432 Sciatica, left side: Secondary | ICD-10-CM

## 2019-05-11 DIAGNOSIS — S39012A Strain of muscle, fascia and tendon of lower back, initial encounter: Secondary | ICD-10-CM

## 2019-05-11 MED ORDER — PREDNISONE 10 MG (21) PO TBPK: ORAL_TABLET | Freq: Every day | ORAL | 0 refills | Status: DC

## 2019-05-11 MED ORDER — TIZANIDINE HCL 4 MG PO TABS: 4.0000 mg | ORAL_TABLET | Freq: Four times a day (QID) | ORAL | 0 refills | Status: DC | PRN

## 2019-05-11 MED ORDER — HYDROCODONE-ACETAMINOPHEN 7.5-325 MG PO TABS: 1.0000 | ORAL_TABLET | Freq: Four times a day (QID) | ORAL | 0 refills | Status: DC | PRN

## 2019-05-11 NOTE — ED Provider Notes (Signed)
[place]    CSN: [id number] Arrival date & time: 05/11/19  1231      History   Chief Complaint Chief Complaint  Patient presents with  . Back Pain    HPI Patrick Solomon is a 49 y.o. male.   HPI  Patient has a long history of back problems.  He has been seen many times for this.  He recently established with a new primary care doctor on 04/14/2019.  They discussed his chronic low back pain.  He was given naproxen 500 mg twice daily and Cymbalta.  He states that this morning he was moving furniture with his mother.  He thinks he bent wrong, but in any event has bilateral back pain.  The muscle moves his back arched stiff and sore.  He also has some pain that goes down his left leg from the buttock to about the knee.  No numbness or weakness.  No bowel or bladder complaint.  He had x-rays most recently done in 2013.  His lumbar spine was negative.  He has not seen a back specialist or had any imaging. No history of any malignancy. No fever, urinary symptoms, history of kidney stones or problems.  Past Medical History:  Diagnosis Date  . Asthma   . Back pain   . Sciatica     There are no problems to display for this patient.   History reviewed. No pertinent surgical history.     Home Medications    Prior to Admission medications   Medication Sig Start Date End Date Taking? Authorizing Provider  DULoxetine (CYMBALTA) 30 MG capsule Take 30 mg by mouth daily.    [provider]  HYDROcodone-acetaminophen (NORCO) 7.5-325 MG tablet Take 1 tablet by mouth every 6 (six) hours as needed for moderate pain. 05/11/19   Raylene Everts, MD  naproxen (NAPROSYN) 500 MG tablet Take 1 tablet (500 mg total) by mouth 2 (two) times daily with a meal. 04/13/19   Kerin Perna, NP  predniSONE (STERAPRED UNI-PAK 21 TAB) 10 MG (21) TBPK tablet Take by mouth daily. tad 05/11/19   Raylene Everts, MD  tiZANidine (ZANAFLEX) 4 MG tablet Take 1-2 tablets (4-8 mg total) by  mouth every 6 (six) hours as needed for muscle spasms. 05/11/19   Raylene Everts, MD    Family History Family History  Problem Relation Age of Onset  . Alcohol abuse Mother   . Cirrhosis Mother   . Cancer Mother        unknown type cancer  . Hypertension Father     Social History Social History   Tobacco Use  . Smoking status: Current Every Day Smoker    Packs/day: 0.50    Years: 29.00    Pack years: 14.50    Types: Cigarettes  . Smokeless tobacco: Never Used  . Tobacco comment: Information for Quitline given  Substance Use Topics  . Alcohol use: Yes    Comment: States drinks once weekly on weekend-beer  . Drug use: No     Allergies   Food   Review of Systems Review of Systems  Musculoskeletal: Positive for back pain.     Physical Exam Triage Vital Signs ED Triage Vitals  Enc Vitals Group     BP 05/11/19 1409 (!) 160/93     Pulse Rate 05/11/19 1409 63     Resp 05/11/19 1409 18     Temp 05/11/19 1409 98.4 F (36.9 C)     Temp Source  05/11/19 1409 Oral     SpO2 05/11/19 1409 96 %     Weight --      Height --      Head Circumference --      Peak Flow --      Pain Score 05/11/19 1410 10     Pain Loc --      Pain Edu? --      Excl. in GC? --    No data found.  Updated Vital Signs BP (!) 160/93 (BP Location: Right Arm)   Pulse 63   Temp 98.4 F (36.9 C) (Oral)   Resp 18   SpO2 96%      Physical Exam Constitutional:      General: He is not in acute distress.    Appearance: He is well-developed and normal weight.     Comments: Appears uncomfortable  HENT:     Head: Normocephalic and atraumatic.     Mouth/Throat:     Comments: Mask in place Eyes:     Conjunctiva/sclera: Conjunctivae normal.     Pupils: Pupils are equal, round, and reactive to light.  Cardiovascular:     Rate and Rhythm: Normal rate.  Pulmonary:     Effort: Pulmonary effort is normal. No respiratory distress.  Musculoskeletal:        General: Tenderness present. Normal  range of motion.     Cervical back: Normal range of motion.     Comments: Both lumbar muscular columns are tight and tender.  Slow but full range of motion.  Strength sensation range of motion and reflexes normal in both lower extremities  Lymphadenopathy:     Cervical: No cervical adenopathy.  Skin:    General: Skin is warm and dry.  Neurological:     Mental Status: He is alert.      UC Treatments / Results  Labs (all labs ordered are listed, but only abnormal results are displayed) Labs Reviewed - No data to display  EKG   Radiology No results found.  Procedures Procedures (including critical care time)  Medications Ordered in UC Medications - No data to display  Initial Impression / Assessment and Plan / UC Course  I have reviewed the triage vital signs and the nursing notes.  Pertinent labs & imaging results that were available during my care of the patient were reviewed by me and considered in my medical decision making (see chart for details).     Reviewed the treatment of lumbar strain. Final Clinical Impressions(s) / UC Diagnoses   Final diagnoses:  Acute myofascial strain of lumbar region, initial encounter  Left sided sciatica     Discharge Instructions     Take the prednisone as directed Take all of day one today Take the muscle relaxer as needed This is helpful at night Take pain medicine as needed Do not drive on the pain medicine See your doctor if not improving in a few days     ED Prescriptions    Medication Sig Dispense Auth. Provider   predniSONE (STERAPRED UNI-PAK 21 TAB) 10 MG (21) TBPK tablet Take by mouth daily. tad 21 tablet Eustace Moore, MD   tiZANidine (ZANAFLEX) 4 MG tablet Take 1-2 tablets (4-8 mg total) by mouth every 6 (six) hours as needed for muscle spasms. 21 tablet Eustace Moore, MD   HYDROcodone-acetaminophen [facility]) 7.5-325 MG tablet Take 1 tablet by mouth every 6 (six) hours as needed for moderate pain. 15  tablet Eustace Moore, MD  I have reviewed the PDMP during this encounter.   Eustace Moore, MD 05/11/19 1515

## 2019-05-11 NOTE — ED Triage Notes (Signed)
Pt with lower back pain with radiation down left leg starting 1 hour ago after lifting some furniture; pt sts hx of similar in past

## 2019-05-11 NOTE — Discharge Instructions (Signed)
Take the prednisone as directed Take all of day one today Take the muscle relaxer as needed This is helpful at night Take pain medicine as needed Do not drive on the pain medicine See your doctor if not improving in a few days

## 2019-05-25 ENCOUNTER — Other Ambulatory Visit (INDEPENDENT_AMBULATORY_CARE_PROVIDER_SITE_OTHER): Payer: Self-pay | Admitting: Primary Care

## 2019-05-25 ENCOUNTER — Encounter (INDEPENDENT_AMBULATORY_CARE_PROVIDER_SITE_OTHER): Payer: Self-pay | Admitting: Primary Care

## 2019-05-25 ENCOUNTER — Ambulatory Visit (INDEPENDENT_AMBULATORY_CARE_PROVIDER_SITE_OTHER): Payer: Self-pay | Admitting: Primary Care

## 2019-05-25 ENCOUNTER — Other Ambulatory Visit: Payer: Self-pay

## 2019-05-25 VITALS — BP 161/97 | HR 78 | Temp 97.3°F | Wt 140.0 lb

## 2019-05-25 DIAGNOSIS — I10 Essential (primary) hypertension: Secondary | ICD-10-CM

## 2019-05-25 DIAGNOSIS — Z720 Tobacco use: Secondary | ICD-10-CM

## 2019-05-25 DIAGNOSIS — G4733 Obstructive sleep apnea (adult) (pediatric): Secondary | ICD-10-CM

## 2019-05-25 DIAGNOSIS — F321 Major depressive disorder, single episode, moderate: Secondary | ICD-10-CM

## 2019-05-25 DIAGNOSIS — F509 Eating disorder, unspecified: Secondary | ICD-10-CM

## 2019-05-25 MED ORDER — LISINOPRIL-HYDROCHLOROTHIAZIDE 20-25 MG PO TABS: 1.0000 | ORAL_TABLET | Freq: Every day | ORAL | 3 refills | Status: DC

## 2019-05-25 MED ORDER — FLUOXETINE HCL 20 MG PO TABS: 20.0000 mg | ORAL_TABLET | Freq: Every day | ORAL | 1 refills | Status: DC

## 2019-05-25 NOTE — Progress Notes (Signed)
Established Patient Office Visit  Subjective:  Patient ID: Patrick Solomon, male    DOB: 10/20/1970  Age: 49 y.o. MRN: [id number]  CC:  Chief Complaint  Patient presents with  . Blood Pressure Check    HPI Patrick Solomon presents for for blood pressure follow up he has had 3 consecutive elevated blood pressure reading < 130/80, African American Male with risk factor smoker 1/2 ppd high blood pressure. Denies , headaches, chest pain or lower extremity edema, sudden onset, vision changes, unilateral weakness, dizziness, paresthesias. Admits to shortness of breath without etiology. He is concerned about stop breathing at night his male friend has to push and rub on his chest to wake him up and snoring.   Past Medical History:  Diagnosis Date  . Asthma   . Back pain   . Sciatica     History reviewed. No pertinent surgical history.  Family History  Problem Relation Age of Onset  . Alcohol abuse Mother   . Cirrhosis Mother   . Cancer Mother        unknown type cancer  . Hypertension Father     Social History   Socioeconomic History  . Marital status: Single    Spouse name: Not on file  . Number of children: 12  . Years of education: 44  . Highest education level: High school graduate  Occupational History  . Not on file  Tobacco Use  . Smoking status: Current Every Day Smoker    Packs/day: 0.50    Years: 29.00    Pack years: 14.50    Types: Cigarettes  . Smokeless tobacco: Never Used  . Tobacco comment: Information for Quitline given  Substance and Sexual Activity  . Alcohol use: Yes    Comment: States drinks once weekly on weekend-beer  . Drug use: No  . Sexual activity: Not on file  Other Topics Concern  . Not on file  Social History Narrative  . Not on file   Social Determinants of Health   Financial Resource Strain:   . Difficulty of Paying Living Expenses:   Food Insecurity:   . Worried About Programme researcher, broadcasting/film/video in the Last Year:   . Engineer, site in the Last Year:   Transportation Needs:   . Freight forwarder (Medical):   Marland Kitchen Lack of Transportation (Non-Medical):   Physical Activity:   . Days of Exercise per Week:   . Minutes of Exercise per Session:   Stress:   . Feeling of Stress :   Social Connections:   . Frequency of Communication with Friends and Family:   . Frequency of Social Gatherings with Friends and Family:   . Attends Religious Services:   . Active Member of Clubs or Organizations:   . Attends Banker Meetings:   Marland Kitchen Marital Status:   Intimate Partner Violence:   . Fear of Current or Ex-Partner:   . Emotionally Abused:   Marland Kitchen Physically Abused:   . Sexually Abused:     Outpatient Medications Prior to Visit  Medication Sig Dispense Refill  . tiZANidine (ZANAFLEX) 4 MG tablet Take 1-2 tablets (4-8 mg total) by mouth every 6 (six) hours as needed for muscle spasms. 21 tablet 0  . DULoxetine (CYMBALTA) 30 MG capsule Take 30 mg by mouth daily.    . naproxen (NAPROSYN) 500 MG tablet Take 1 tablet (500 mg total) by mouth 2 (two) times daily with a meal. 90 tablet 1  . HYDROcodone-acetaminophen (NORCO)  7.5-325 MG tablet Take 1 tablet by mouth every 6 (six) hours as needed for moderate pain. 15 tablet 0  . predniSONE (STERAPRED UNI-PAK 21 TAB) 10 MG (21) TBPK tablet Take by mouth daily. tad 21 tablet 0   No facility-administered medications prior to visit.    Allergies  Allergen Reactions  . Food     Pickles/Peanuts/Tuna Fish---Rash/Spots on body    ROS Review of Systems  Constitutional:       Anorexia   Gastrointestinal: Positive for diarrhea.  Psychiatric/Behavioral: Positive for agitation and sleep disturbance.       Depression  All other systems reviewed and are negative.     Objective:    Physical Exam  Constitutional: He is oriented to person, place, and time. He appears well-developed and well-nourished.  HENT:  Head: Normocephalic.  Cardiovascular: Normal rate.   Pulmonary/Chest: Effort normal and breath sounds normal.  Abdominal: Soft. Bowel sounds are normal.  Musculoskeletal:     Cervical back: Normal range of motion.  Neurological: He is alert and oriented to person, place, and time.  Skin: Skin is warm and dry.  Psychiatric: He has a normal mood and affect. His behavior is normal. Judgment and thought content normal.    BP (!) 161/97 (BP Location: Right Arm, Patient Position: Sitting, Cuff Size: Normal)   Pulse 78   Temp (!) 97.3 F (36.3 C) (Temporal)   Wt 140 lb (63.5 kg)   SpO2 98%   BMI 20.09 kg/m  Wt Readings from Last 3 Encounters:  05/25/19 140 lb (63.5 kg)  04/13/19 142 lb 9.6 oz (64.7 kg)  12/13/17 133 lb (60.3 kg)     Health Maintenance Due  Topic Date Due  . COVID-19 Vaccine (1) Never done  . COLONOSCOPY  Never done    There are no preventive care reminders to display for this patient.  No results found for: TSH Lab Results  Component Value Date   WBC 5.6 04/13/2019   HGB 15.2 04/13/2019   HCT 44.0 04/13/2019   MCV 87 04/13/2019   PLT 221 04/13/2019   Lab Results  Component Value Date   NA 141 04/13/2019   K 4.5 04/13/2019   CO2 23 04/13/2019   GLUCOSE 86 04/13/2019   BUN 13 04/13/2019   CREATININE 0.97 04/13/2019   BILITOT 0.8 04/13/2019   ALKPHOS 56 04/13/2019   AST 24 04/13/2019   ALT 14 04/13/2019   PROT 6.6 04/13/2019   ALBUMIN 4.2 04/13/2019   CALCIUM 9.2 04/13/2019   ANIONGAP 7 04/14/2014   Lab Results  Component Value Date   CHOL 164 04/13/2019   Lab Results  Component Value Date   HDL 79 04/13/2019   Lab Results  Component Value Date   LDLCALC 72 04/13/2019   Lab Results  Component Value Date   TRIG 67 04/13/2019   Lab Results  Component Value Date   CHOLHDL 2.1 04/13/2019   No results found for: HGBA1C    Assessment & Plan:  Patrick Solomon was seen today for blood pressure check.  Diagnoses and all orders for this visit:  OSA (obstructive sleep apnea) Patient presents  with possible obstructive sleep apnea. Patent has a  years history of symptoms of morning fatigue and hypertension. Patient generally gets 4 or 5 hours of sleep per night, and states they generally have difficulty falling asleep. Snoring of severe severity is present. Apneic episodes are present. Nasal obstruction is not present.  Patient has not had tonsillectomy.   Tobacco abuse  He is aware of 1/2 ppd can cause worse affects on asthma, OSA  increased risk for lung cancer and other respiratory diseases recommend cessation.  This will be reminded at each clinical visit.  Current moderate episode of major depressive disorder, unspecified whether recurrent (HCC) Cymbalta adverse reactions causing diarrhea several times a day reviewed side affect and has decrease appetite and GI upset change from a SNRI to a SSRI Prozac 20mg  daily follow up in 1 month   Eating disorder, unspecified type Loss of appetite self treat for eating unknown etiology, smoking does act as a appetitive suppressant discussed cessation and supplementing boast/ ensure and protein shakes.   Essential hypertension Counseled on blood pressure goal of less than 130/80, low-sodium, DASH diet, medication compliance, 150 minutes of moderate intensity exercise per week. Discussed medication compliance, adverse effects. Dry cough angioedema, rash . Started on lisinopril/HCTZ 20/25 daily   Other orders -     FLUoxetine (PROZAC) 20 MG tablet; Take 1 tablet (20 mg total) by mouth daily. -     lisinopril-hydrochlorothiazide (ZESTORETIC) 20-25 MG tablet; Take 1 tablet by mouth daily.    Meds ordered this encounter  Medications  . FLUoxetine (PROZAC) 20 MG tablet    Sig: Take 1 tablet (20 mg total) by mouth daily.    Dispense:  90 tablet    Refill:  1  . lisinopril-hydrochlorothiazide (ZESTORETIC) 20-25 MG tablet    Sig: Take 1 tablet by mouth daily.    Dispense:  30 tablet    Refill:  3    Follow-up: Return in about 4 weeks  (around 06/22/2019) for Bp check and depression.    Kerin Perna, NP

## 2019-05-25 NOTE — Patient Instructions (Signed)

## 2019-05-26 ENCOUNTER — Telehealth (INDEPENDENT_AMBULATORY_CARE_PROVIDER_SITE_OTHER): Payer: Self-pay

## 2019-05-26 MED ORDER — LISINOPRIL-HYDROCHLOROTHIAZIDE 20-25 MG PO TABS: 1.0000 | ORAL_TABLET | Freq: Every day | ORAL | 3 refills | Status: DC

## 2019-05-26 MED ORDER — FLUOXETINE HCL 20 MG PO TABS
20.0000 mg | ORAL_TABLET | Freq: Every day | ORAL | 1 refills | Status: DC
Start: 2019-05-26 — End: 2020-08-12

## 2019-05-26 MED FILL — FLUoxetine HCL 20 MG TABS: 20 | 30 days supply | Qty: 30 | Fill #0

## 2019-05-26 MED FILL — LISINOPRIL-HCTZ 20-25 MG TA: 20-25 | 30 days supply | Qty: 30 | Fill #0

## 2019-05-26 NOTE — Telephone Encounter (Signed)
Medications have been sent

## 2019-05-26 NOTE — Telephone Encounter (Signed)
Patient will like for all medications to be sent to [facility] and [facility]. Patient states it would be more affordable.   Please advice [phone]

## 2019-06-05 ENCOUNTER — Other Ambulatory Visit (HOSPITAL_COMMUNITY)
Admission: RE | Admit: 2019-06-05 | Discharge: 2019-06-05 | Disposition: A | Discharge status: Home / Self Care | Payer: HRSA Program | Source: Ambulatory Visit | Attending: Internal Medicine | Admitting: Internal Medicine

## 2019-06-05 DIAGNOSIS — Z20822 Contact with and (suspected) exposure to covid-19: Secondary | ICD-10-CM | POA: Diagnosis not present

## 2019-06-05 DIAGNOSIS — Z01812 Encounter for preprocedural laboratory examination: Secondary | ICD-10-CM | POA: Diagnosis present

## 2019-06-05 LAB — SARS CORONAVIRUS 2 (TAT 6-24 HRS): SARS Coronavirus 2: NEGATIVE

## 2019-06-08 ENCOUNTER — Ambulatory Visit (HOSPITAL_BASED_OUTPATIENT_CLINIC_OR_DEPARTMENT_OTHER): Payer: Self-pay | Attending: Primary Care | Admitting: Internal Medicine

## 2019-06-16 ENCOUNTER — Encounter (HOSPITAL_COMMUNITY): Payer: Self-pay | Admitting: Emergency Medicine

## 2019-06-16 ENCOUNTER — Emergency Department (HOSPITAL_COMMUNITY)
Admission: EM | Admit: 2019-06-16 | Discharge: 2019-06-17 | Disposition: A | Discharge status: Home / Self Care | Payer: Self-pay | Attending: Emergency Medicine | Admitting: Emergency Medicine

## 2019-06-16 DIAGNOSIS — F10920 Alcohol use, unspecified with intoxication, uncomplicated: Secondary | ICD-10-CM | POA: Insufficient documentation

## 2019-06-16 DIAGNOSIS — F332 Major depressive disorder, recurrent severe without psychotic features: Secondary | ICD-10-CM | POA: Insufficient documentation

## 2019-06-16 DIAGNOSIS — F17210 Nicotine dependence, cigarettes, uncomplicated: Secondary | ICD-10-CM | POA: Insufficient documentation

## 2019-06-16 DIAGNOSIS — F1410 Cocaine abuse, uncomplicated: Secondary | ICD-10-CM | POA: Insufficient documentation

## 2019-06-16 DIAGNOSIS — Z79899 Other long term (current) drug therapy: Secondary | ICD-10-CM | POA: Insufficient documentation

## 2019-06-16 DIAGNOSIS — T50902A Poisoning by unspecified drugs, medicaments and biological substances, intentional self-harm, initial encounter: Secondary | ICD-10-CM | POA: Insufficient documentation

## 2019-06-16 DIAGNOSIS — Y907 Blood alcohol level of 200-239 mg/100 ml: Secondary | ICD-10-CM | POA: Insufficient documentation

## 2019-06-16 DIAGNOSIS — J45909 Unspecified asthma, uncomplicated: Secondary | ICD-10-CM | POA: Insufficient documentation

## 2019-06-16 DIAGNOSIS — Z20822 Contact with and (suspected) exposure to covid-19: Secondary | ICD-10-CM | POA: Insufficient documentation

## 2019-06-16 DIAGNOSIS — F1994 Other psychoactive substance use, unspecified with psychoactive substance-induced mood disorder: Secondary | ICD-10-CM | POA: Insufficient documentation

## 2019-06-16 LAB — SALICYLATE LEVEL: Salicylate Lvl: <7 mg/dL — ABNORMAL LOW (ref 7.0–30.0)

## 2019-06-16 LAB — COMPREHENSIVE METABOLIC PANEL
ALT: 25 U/L (ref 0–44)
AST: 32 U/L (ref 15–41)
Albumin: 4.3 g/dL (ref 3.5–5.0)
Alkaline Phosphatase: 39 U/L (ref 38–126)
Anion gap: 14 (ref 5–15)
BUN: 15 mg/dL (ref 6–20)
CO2: 26 mmol/L (ref 22–32)
Calcium: 8.9 mg/dL (ref 8.9–10.3)
Chloride: 107 mmol/L (ref 98–111)
Creatinine, Ser: 1.28 mg/dL — ABNORMAL HIGH (ref 0.61–1.24)
GFR calc Af Amer: >60 mL/min (ref >60)
GFR calc non Af Amer: >60 mL/min (ref >60)
Glucose, Bld: 94 mg/dL (ref 70–99)
Potassium: 3.8 mmol/L (ref 3.5–5.1)
Sodium: 147 mmol/L — ABNORMAL HIGH (ref 135–145)
Total Bilirubin: 0.7 mg/dL (ref 0.3–1.2)
Total Protein: 7.5 g/dL (ref 6.5–8.1)

## 2019-06-16 LAB — CBC
HCT: 44.5 % (ref 39.0–52.0)
Hemoglobin: 14.8 g/dL (ref 13.0–17.0)
MCH: 29.9 pg (ref 26.0–34.0)
MCHC: 33.3 g/dL (ref 30.0–36.0)
MCV: 89.9 fL (ref 80.0–100.0)
Platelets: 301 10*3/uL (ref 150–400)
RBC: 4.95 MIL/uL (ref 4.22–5.81)
RDW: 12 % (ref 11.5–15.5)
WBC: 8.2 10*3/uL (ref 4.0–10.5)
nRBC: 0 % (ref 0.0–0.2)

## 2019-06-16 LAB — RAPID URINE DRUG SCREEN, HOSP PERFORMED
Amphetamines: NOT DETECTED
Barbiturates: NOT DETECTED
Benzodiazepines: NOT DETECTED
Cocaine: POSITIVE — AB
Opiates: NOT DETECTED
Tetrahydrocannabinol: NOT DETECTED

## 2019-06-16 LAB — ETHANOL: Alcohol, Ethyl (B): 201 mg/dL — ABNORMAL HIGH (ref <10)

## 2019-06-16 LAB — SARS CORONAVIRUS 2 BY RT PCR (HOSPITAL ORDER, PERFORMED IN ~~LOC~~ HOSPITAL LAB): SARS Coronavirus 2: NEGATIVE

## 2019-06-16 LAB — ACETAMINOPHEN LEVEL: Acetaminophen (Tylenol), Serum: <10 ug/mL — ABNORMAL LOW (ref 10–30)

## 2019-06-16 MED ORDER — SODIUM CHLORIDE 0.9 % IV BOLUS
1000.0000 mL | Freq: Once | INTRAVENOUS | Status: AC
  Administered 2019-06-16: 1000 mL via INTRAVENOUS

## 2019-06-16 NOTE — BH Assessment (Signed)
Assessment Note  Patrick Solomon is an 49 y.o. male that presents this date voluntary after a intentional overdose on trazodone between 0700 and 0800 hours earlier this date. Patient reports he took up to 30 tablets. Patient states that this was not a attempt to take his life, patient states he "was just trying to get his girlfriend's attention after a argument." Patient denies any previous attempts or gestures at self harm. Patient denies any H/I or AVH. Patient reports he was diagnosed with depression over two years ago and is currently receiving OP care by a provider although he cannot recall that provider's name. Patient states he is currently compliant with that medication regimen and denies any  symptoms. Patient reports ongoing SA issues to include: alcohol use three to four times a week stating he consumes "a few beers" patient is vague in reference to use patterns although reports last use prior to arrival when he states he "had a couple drinks." Patient reports he also used cocaine last night with UDS positive for that substance. Patient's BAL was 201 on arrival. Patient is vague in reference to use patterns. Patient denies any prior hospital admissions associated with mental health. Per notes this date on arrival.   Steinl MD notes: "Patient with hx depression, presents from home via EMS, s/p reported intentional overdose on trazodone between 7 and 8 this AM. Took up to 30 tablets. Pt indicates was feeling stressed, overwhelmed, and depressed. Symptoms acute onset, episodic. Denies other ingestion. EMS reported significant other at scene also reported patient drank two 40 ounce beers.  cocaine. denies any other    indicates was feeling stressed, overwhelmed, and depressed. Symptoms acute onset, episodic. Denies other ingestion. EMS reported significant other at scene also reported patient drank two 40 ounce beers. Denies daily etoh use, or hx complicated etoh withdrawal. Denies specific physical  complaints. No pain. No nv".   Patient gives permission to speak with mother. Collateral information obtained from Lonie Peak [phone] (mother) who reports patient "needs some help" and states that patient has been practicing some high risk behaviors associated with SA issues and feels if the patient is not safe to be discharged. Mother states she 'has been trying to get him some help but he won't listen." Case was staffed with Rankin NP who also evaluated patient and recommended patient be observed and monitored for safety.      Diagnosis: F33.2 MDD recurrent without psychotic features, severe, Alcohol abuse, Cocaine use  Past Medical History:  Past Medical History:  Diagnosis Date  . Asthma   . Back pain   . Sciatica     History reviewed. No pertinent surgical history.  Family History:  Family History  Problem Relation Age of Onset  . Alcohol abuse Mother   . Cirrhosis Mother   . Cancer Mother        unknown type cancer  . Hypertension Father     Social History:  reports that he has been smoking cigarettes. He has a 14.50 pack-year smoking history. He has never used smokeless tobacco. He reports current alcohol use. He reports that he does not use drugs.  Additional Social History:  Alcohol / Drug Use Pain Medications: See MAR Prescriptions: See MAR Over the Counter: See MAR History of alcohol / drug use?: Yes Longest period of sobriety (when/how long): Unknown Negative Consequences of Use: (Denies) Withdrawal Symptoms: (Denies) Substance #1 Name of Substance 1: Alcohol 1 - Age of First Use: 15 1 - Amount (size/oz): Varies 1 -  Frequency: Varies 1 - Duration: Ongoing 1 - Last Use / Amount: 06/15/19 "a few beers"  CIWA: CIWA-Ar BP: 112/74 Pulse Rate: 80 COWS:    Allergies:  Allergies  Allergen Reactions  . Food     Pickles/Peanuts/Tuna Fish---Rash/Spots on body    Home Medications: (Not in a hospital admission)   OB/GYN Status:  No LMP for male  patient.  General Assessment Data Location of Assessment: [facility] TTS Assessment: In system Is this a Tele or Face-to-Face Assessment?: Face-to-Face Is this an Initial Assessment or a Re-assessment for this encounter?: Initial Assessment Patient Accompanied by:: N/A Language Other than English: No Living Arrangements: Other (Comment)(Partner) What gender do you identify as?: Male Marital status: Single Living Arrangements: Spouse/significant other Can pt return to current living arrangement?: Yes Admission Status: Voluntary Is patient capable of signing voluntary admission?: Yes Referral Source: Self/Family/Friend Insurance type: SP  Medical Screening Exam ([place]) Medical Exam completed: Yes  Crisis Care Plan Living Arrangements: Spouse/significant other Legal Guardian: (NA) Name of Psychiatrist: Pt cannot recall Name of Therapist: None  Education Status Is patient currently in school?: No Is the patient employed, unemployed or receiving disability?: Employed  Risk to self with the past 6 months Suicidal Ideation: No Has patient been a risk to self within the past 6 months prior to admission? : Yes Suicidal Intent: No Has patient had any suicidal intent within the past 6 months prior to admission? : No Is patient at risk for suicide?: Yes Suicidal Plan?: No Has patient had any suicidal plan within the past 6 months prior to admission? : Yes Specify Current Suicidal Plan: Pt attempted to OD earlier Access to Means: Yes Specify Access to Suicidal Means: Pt attempted to OD earlier this date What has been your use of drugs/alcohol within the last 12 months?: Current use Previous Attempts/Gestures: No How many times?: 0 Other Self Harm Risks: (NA) Triggers for Past Attempts: (NA) Intentional Self Injurious Behavior: None Family Suicide History: No Recent stressful life event(s): Conflict (Comment)(Issues with partner) Persecutory voices/beliefs?: No Depression:  No Depression Symptoms: (Denies) Substance abuse history and/or treatment for substance abuse?: No Suicide prevention information given to non-admitted patients: Not applicable  Risk to Others within the past 6 months Homicidal Ideation: No Does patient have any lifetime risk of violence toward others beyond the six months prior to admission? : No Thoughts of Harm to Others: No Current Homicidal Intent: No Current Homicidal Plan: No Access to Homicidal Means: No Identified Victim: NA History of harm to others?: No Assessment of Violence: None Noted Violent Behavior Description: NA Does patient have access to weapons?: No Criminal Charges Pending?: No Does patient have a court date: No Is patient on probation?: No  Psychosis Hallucinations: None noted Delusions: None noted  Mental Status Report Appearance/Hygiene: Unremarkable Eye Contact: Fair Motor Activity: Freedom of movement Speech: Logical/coherent Level of Consciousness: Alert Mood: Pleasant Affect: Appropriate to circumstance Anxiety Level: None Thought Processes: Coherent, Relevant Judgement: Partial Orientation: Person, Place, Time Obsessive Compulsive Thoughts/Behaviors: None  Cognitive Functioning Concentration: Normal Memory: Recent Intact, Remote Intact Is patient IDD: No Insight: Fair Impulse Control: Fair Appetite: Good Have you had any weight changes? : No Change Sleep: No Change Total Hours of Sleep: 7 Vegetative Symptoms: None  ADLScreening [facility] Assessment Services) Patient's cognitive ability adequate to safely complete daily activities?: Yes Patient able to express need for assistance with ADLs?: Yes Independently performs ADLs?: Yes (appropriate for developmental age)  Prior Inpatient Therapy Prior Inpatient Therapy: No  Prior Outpatient Therapy Prior Outpatient Therapy: Yes Prior Therapy Dates: Ongoing Prior Therapy Facilty/Provider(s): Pt cannot recall Reason for Treatment: [facility] Does patient have an ACCT team?: No Does patient have Intensive In-House Services?  : No Does patient have [facility] services? : No Does patient have P4CC services?: No  ADL Screening (condition at time of admission) Patient's cognitive ability adequate to safely complete daily activities?: Yes Is the patient deaf or have difficulty hearing?: No Does the patient have difficulty seeing, even when wearing glasses/contacts?: No Does the patient have difficulty concentrating, remembering, or making decisions?: No Patient able to express need for assistance with ADLs?: Yes Does the patient have difficulty dressing or bathing?: No Independently performs ADLs?: Yes (appropriate for developmental age) Does the patient have difficulty walking or climbing stairs?: No Weakness of Legs: None Weakness of Arms/Hands: None  Home Assistive Devices/Equipment Home Assistive Devices/Equipment: None  Therapy Consults (therapy consults require a physician order) PT Evaluation Needed: No OT Evalulation Needed: No SLP Evaluation Needed: No Abuse/Neglect Assessment (Assessment to be complete while patient is alone) Abuse/Neglect Assessment Can Be Completed: Yes Physical Abuse: Denies Verbal Abuse: Denies Sexual Abuse: Denies Exploitation of patient/patient's resources: Denies Self-Neglect: Denies Values / Beliefs Cultural Requests During Hospitalization: None Spiritual Requests During Hospitalization: None Consults Spiritual Care Consult Needed: No Transition of Care Team Consult Needed: No Advance Directives (For Healthcare) Does Patient Have a Medical Advance Directive?: No Would patient like information on creating a medical advance directive?: No - Patient declined          Disposition: Case was staffed with Rankin NP who also evaluated patient and recommended patient be observed and monitored for safety Disposition Initial Assessment Completed for this Encounter: Yes Disposition of  Patient: (Observe and monitor)  On Site Evaluation by:   Reviewed with Physician:    Alfredia Ferguson 06/16/2019 6:09 PM

## 2019-06-16 NOTE — ED Provider Notes (Addendum)
[facility]-EMERGENCY DEPT Provider Note   CSN: [id number] Arrival date & time: 06/16/19  0900     History Chief Complaint  Patient presents with  . Suicidal  . Drug Overdose    Patrick Solomon is a 49 y.o. male.  Patient with hx depression, presents from home via EMS, s/p reported intentional overdose on trazodone between 7 and 8 this AM. Took up to 30 tablets. Pt indicates was feeling stressed, overwhelmed, and depressed. Symptoms acute onset, episodic. Denies other ingestion. EMS reported significant other at scene also reported patient drank two 40 ounce beers. Denies daily etoh use, or hx complicated etoh withdrawal. Denies specific physical complaints. No pain. No nv.   The history is provided by the patient and the EMS personnel.       Past Medical History:  Diagnosis Date  . Asthma   . Back pain   . Sciatica     There are no problems to display for this patient.   History reviewed. No pertinent surgical history.     Family History  Problem Relation Age of Onset  . Alcohol abuse Mother   . Cirrhosis Mother   . Cancer Mother        unknown type cancer  . Hypertension Father     Social History   Tobacco Use  . Smoking status: Current Every Day Smoker    Packs/day: 0.50    Years: 29.00    Pack years: 14.50    Types: Cigarettes  . Smokeless tobacco: Never Used  . Tobacco comment: Information for Quitline given  Substance Use Topics  . Alcohol use: Yes    Comment: States drinks once weekly on weekend-beer  . Drug use: No    Home Medications Prior to Admission medications   Medication Sig Start Date End Date Taking? Authorizing Provider  FLUoxetine (PROZAC) 20 MG tablet Take 1 tablet (20 mg total) by mouth daily. 05/26/19   Grayce Sessions, NP  lisinopril-hydrochlorothiazide (ZESTORETIC) 20-25 MG tablet Take 1 tablet by mouth daily. 05/26/19   Grayce Sessions, NP  tiZANidine (ZANAFLEX) 4 MG tablet Take 1-2 tablets (4-8 mg  total) by mouth every 6 (six) hours as needed for muscle spasms. 05/11/19   Eustace Moore, MD    Allergies    Food  Review of Systems   Review of Systems  Constitutional: Negative for fever.  HENT: Negative for sore throat.   Eyes: Negative for visual disturbance.  Respiratory: Negative for shortness of breath.   Cardiovascular: Negative for chest pain.  Gastrointestinal: Negative for abdominal pain.  Genitourinary: Negative for flank pain.  Musculoskeletal: Negative for back pain and neck pain.  Skin: Negative for rash.  Neurological: Negative for headaches.  Hematological: Does not bruise/bleed easily.  Psychiatric/Behavioral: Positive for dysphoric mood.    Physical Exam Updated Vital Signs BP 136/76 (BP Location: Right Arm)   Pulse 92   Temp 97.6 F (36.4 C) (Oral)   Resp 18   SpO2 98%   Physical Exam Vitals and nursing note reviewed.  Constitutional:      Appearance: Normal appearance. He is well-developed.  HENT:     Head: Atraumatic.     Nose: Nose normal.     Mouth/Throat:     Mouth: Mucous membranes are moist.     Pharynx: Oropharynx is clear.  Eyes:     General: No scleral icterus.    Conjunctiva/sclera: Conjunctivae normal.     Pupils: Pupils are equal, round, and reactive to  light.  Neck:     Trachea: No tracheal deviation.  Cardiovascular:     Rate and Rhythm: Normal rate and regular rhythm.     Pulses: Normal pulses.     Heart sounds: Normal heart sounds. No murmur. No friction rub. No gallop.   Pulmonary:     Effort: Pulmonary effort is normal. No accessory muscle usage or respiratory distress.     Breath sounds: Normal breath sounds.  Abdominal:     General: Bowel sounds are normal. There is no distension.     Palpations: Abdomen is soft.     Tenderness: There is no abdominal tenderness.  Genitourinary:    Comments: No cva tenderness. Musculoskeletal:        General: No swelling.     Cervical back: Normal range of motion and neck  supple. No rigidity.  Skin:    General: Skin is warm and dry.     Findings: No rash.  Neurological:     Mental Status: He is alert.     Comments: Alert, speech clear.   Psychiatric:     Comments: Awake, alert. Depressed mood.      ED Results / Procedures / Treatments   Labs (all labs ordered are listed, but only abnormal results are displayed) Results for orders placed or performed during the hospital encounter of 06/16/19  SARS Coronavirus 2 by RT PCR (hospital order, performed in [facility] hospital lab) Nasopharyngeal Nasopharyngeal Swab   Specimen: Nasopharyngeal Swab  Result Value Ref Range   SARS Coronavirus 2 NEGATIVE NEGATIVE  CBC  Result Value Ref Range   WBC 8.2 4.0 - 10.5 K/uL   RBC 4.95 4.22 - 5.81 MIL/uL   Hemoglobin 14.8 13.0 - 17.0 g/dL   HCT 66.0 63.0 - 16.0 %   MCV 89.9 80.0 - 100.0 fL   MCH 29.9 26.0 - 34.0 pg   MCHC 33.3 30.0 - 36.0 g/dL   RDW 10.9 32.3 - 55.7 %   Platelets 301 150 - 400 K/uL   nRBC 0.0 0.0 - 0.2 %  Comprehensive metabolic panel  Result Value Ref Range   Sodium 147 (H) 135 - 145 mmol/L   Potassium 3.8 3.5 - 5.1 mmol/L   Chloride 107 98 - 111 mmol/L   CO2 26 22 - 32 mmol/L   Glucose, Bld 94 70 - 99 mg/dL   BUN 15 6 - 20 mg/dL   Creatinine, Ser 3.22 (H) 0.61 - 1.24 mg/dL   Calcium 8.9 8.9 - 02.5 mg/dL   Total Protein 7.5 6.5 - 8.1 g/dL   Albumin 4.3 3.5 - 5.0 g/dL   AST 32 15 - 41 U/L   ALT 25 0 - 44 U/L   Alkaline Phosphatase 39 38 - 126 U/L   Total Bilirubin 0.7 0.3 - 1.2 mg/dL   GFR calc non Af Amer >60 >60 mL/min   GFR calc Af Amer >60 >60 mL/min   Anion gap 14 5 - 15  Acetaminophen level  Result Value Ref Range   Acetaminophen (Tylenol), Serum <10 (L) 10 - 30 ug/mL  Salicylate level  Result Value Ref Range   Salicylate Lvl <7.0 (L) 7.0 - 30.0 mg/dL  Rapid urine drug screen (hospital performed)  Result Value Ref Range   Opiates NONE DETECTED NONE DETECTED   Cocaine POSITIVE (A) NONE DETECTED   Benzodiazepines NONE  DETECTED NONE DETECTED   Amphetamines NONE DETECTED NONE DETECTED   Tetrahydrocannabinol NONE DETECTED NONE DETECTED   Barbiturates NONE DETECTED NONE DETECTED  Ethanol  Result Value Ref Range   Alcohol, Ethyl (B) 201 (H) <10 mg/dL   ED ECG REPORT   Date: 06/16/2019  Rate: 99  Rhythm: normal sinus rhythm  QRS Axis: normal  Intervals: normal  ST/T Wave abnormalities: nonspecific ST/T changes  Conduction Disutrbances:none  Narrative Interpretation:   Old EKG Reviewed: none available  I have personally reviewed the EKG tracing  Radiology No results found.  Procedures Procedures (including critical care time)  Medications Ordered in ED Medications  sodium chloride 0.9 % bolus 1,000 mL (1,000 mLs Intravenous New Bag/Given 06/16/19 1003)    ED Course  I have reviewed the triage vital signs and the nursing notes.  Pertinent labs & imaging results that were available during my care of the patient were reviewed by me and considered in my medical decision making (see chart for details).    MDM Rules/Calculators/A&P                      Iv ns. Continuous pulse ox and monitor. Stat labs. Ecg.   Poison control notified - recommendations obtained.   Reviewed nursing notes and prior charts for additional history.  No recent admission or ED eval for same.   Initial labs reviewed/interpreted by me - chem normal except na sl elev. Ns bolus.   [facility] team consulted.   Additional labs reviewed/interpreted by me -  uds + cocaine. etoh elevated.   Recheck pt, awake and alert, no resp depression. bp normal.  Await BH eval.   Additional recheck, awake, alert, vitals normal. Breathing comfortably.   The patient has been placed in psychiatric observation due to the need to provide a safe environment for the patient while obtaining psychiatric consultation and evaluation, as well as ongoing medical and medication management to treat the patient's condition.  The patient has not been  placed under full IVC at this time.  Disposition per [facility] team.     Final Clinical Impression(s) / ED Diagnoses Final diagnoses:  None    Rx / DC Orders ED Discharge Orders    None           Lajean Saver, MD 06/16/19 1320

## 2019-06-16 NOTE — ED Notes (Signed)
Please contact Lonie Peak for care plan and updates [phone]

## 2019-06-16 NOTE — ED Notes (Signed)
Pt has been seen by security and wand.   Pt has 1 bag of belongings at the nurses' station .

## 2019-06-16 NOTE — BH Assessment (Signed)
[facility] Assessment Progress Note  Case was staffed with Rankin NP who also evaluated patient and recommended patient be observed and monitored for safety

## 2019-06-16 NOTE — ED Triage Notes (Signed)
Patient here from home reporting suicidal ideation. Depression "feels overwhelmed". States that he ingested 30+ trazadone and 2 40oz 3 hours ago.

## 2019-06-16 NOTE — ED Notes (Signed)
Updated Beth at [employer]- provided information on lab results, ekg results.  Per Waynetta Sandy- continue to monitor, offer supportive care.  Poison Control will follow up again in a few hours.

## 2019-06-16 NOTE — ED Notes (Signed)
[facility] Poison Control: watch for seizure, bradycardia, QT prolongation and T wave inversion  EKG, 4 hour Tylenol level, CMP, watch for 6 hours, repeat EKG if symptomatic.

## 2019-06-17 ENCOUNTER — Encounter (HOSPITAL_COMMUNITY): Payer: Self-pay | Admitting: Registered Nurse

## 2019-06-17 DIAGNOSIS — F1410 Cocaine abuse, uncomplicated: Secondary | ICD-10-CM

## 2019-06-17 DIAGNOSIS — F1994 Other psychoactive substance use, unspecified with psychoactive substance-induced mood disorder: Secondary | ICD-10-CM
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
## 2019-06-17 NOTE — ED Notes (Signed)
Pt ambulated to the restroom with no assistance. Pt given water and applesauce upon request.

## 2019-06-17 NOTE — ED Provider Notes (Signed)
Emergency Medicine Observation Re-evaluation Note  Patrick Solomon is a 49 y.o. male, seen on rounds today.  Pt initially presented to the ED for complaints of Suicidal and Drug Overdose Currently, the patient is repeat assessment by psychiatry.  Physical Exam  BP (!) 98/59 (BP Location: Right Arm)   Pulse 89   Temp 99.7 F (37.6 C) (Oral)   Resp 16   SpO2 93%  Physical Exam  ED Course / MDM  EKG:    I have reviewed the labs performed to date as well as medications administered while in observation.  Recent changes in the last 24 hours include none. Plan  Current plan is for reassessment. Patient is not under full IVC at this time.   Lorre Nick, MD 06/17/19 [phone]

## 2019-06-17 NOTE — ED Notes (Signed)
Pt resting in bed calmly and quietly. Sitter remains at bedside. Equal rise and fall of chest noted.

## 2019-06-19 ENCOUNTER — Ambulatory Visit (HOSPITAL_COMMUNITY)
Admission: EM | Admit: 2019-06-19 | Discharge: 2019-06-19 | Disposition: A | Discharge status: Home / Self Care | Payer: Self-pay | Attending: Family Medicine | Admitting: Family Medicine

## 2019-06-19 ENCOUNTER — Other Ambulatory Visit: Payer: Self-pay

## 2019-06-19 ENCOUNTER — Encounter (HOSPITAL_COMMUNITY): Payer: Self-pay

## 2019-06-19 DIAGNOSIS — S161XXA Strain of muscle, fascia and tendon at neck level, initial encounter: Secondary | ICD-10-CM

## 2019-06-19 HISTORY — DX: Essential (primary) hypertension: I10

## 2019-06-19 HISTORY — DX: Depression, unspecified: F32.A

## 2019-06-19 MED ORDER — IBUPROFEN 800 MG PO TABS
800.0000 mg | ORAL_TABLET | Freq: Three times a day (TID) | ORAL | 0 refills | Status: DC
Start: 2019-06-19 — End: 2020-08-09

## 2019-06-19 MED ORDER — TIZANIDINE HCL 4 MG PO TABS: 4.0000 mg | ORAL_TABLET | Freq: Four times a day (QID) | ORAL | 0 refills | Status: DC | PRN

## 2019-06-19 NOTE — ED Provider Notes (Signed)
[facility]    CSN: [id number] Arrival date & time: 06/19/19  1328      History   Chief Complaint Chief Complaint  Patient presents with  . Neck Pain    HPI Patrick Solomon is a 49 y.o. male.   HPI  Patient states he was unable to go to work today because his neck was hurting.  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  He does not think that he injured his neck, he had no fall or trauma.  He states he was in the hospital earlier in the week and a couple days recovering on the couch.  He thinks the inactivity made it made him stiff and sore.  No numbness or weakness in arms.  No headache.  No prior history of neck problems.  He is not taking any medicine for the neck pain. Patient has followed up with counseling.  He states that he is feeling better  Past Medical History:  Diagnosis Date  . Asthma   . Back pain   . Depression   . Hypertension   . Sciatica     Patient Active Problem List   Diagnosis Date Noted  . Cocaine abuse ([facility]) 06/17/2019  . Substance induced mood disorder ([facility]) 06/17/2019    No past surgical history on file.     Home Medications    Prior to Admission medications   Medication Sig Start Date End Date Taking? Authorizing Provider  FLUoxetine (PROZAC) 20 MG tablet Take 1 tablet (20 mg total) by mouth daily. 05/26/19  Yes Grayce Sessions, NP  lisinopril-hydrochlorothiazide (ZESTORETIC) 20-25 MG tablet Take 1 tablet by mouth daily. 05/26/19  Yes Grayce Sessions, NP  ibuprofen (ADVIL) 800 MG tablet Take 1 tablet (800 mg total) by mouth 3 (three) times daily. 06/19/19   Eustace Moore, MD  naproxen (NAPROSYN) 500 MG tablet Take 500 mg by mouth 2 (two) times daily with a meal.    [provider]  tiZANidine (ZANAFLEX) 4 MG tablet Take 1-2 tablets (4-8 mg total) by mouth every 6 (six) hours as needed for muscle spasms. 06/19/19   Eustace Moore, MD    Family History Family History  Problem  Relation Age of Onset  . Alcohol abuse Mother   . Cirrhosis Mother   . Cancer Mother        unknown type cancer  . Hypertension Father     Social History Social History   Tobacco Use  . Smoking status: Current Every Day Smoker    Packs/day: 0.50    Years: 29.00    Pack years: 14.50    Types: Cigarettes  . Smokeless tobacco: Never Used  . Tobacco comment: Information for Quitline given  Substance Use Topics  . Alcohol use: Yes    Comment: States drinks once weekly on weekend-beer  . Drug use: No     Allergies   Food   Review of Systems Review of Systems  Musculoskeletal: Positive for neck pain and neck stiffness.  Neurological: Negative for weakness and numbness.     Physical Exam Triage Vital Signs ED Triage Vitals  Enc Vitals Group     BP 06/19/19 1414 112/76     Pulse Rate 06/19/19 1414 72     Resp 06/19/19 1414 16     Temp 06/19/19 1414 98.5 F (36.9 C)     Temp Source 06/19/19 1414 Oral     SpO2 06/19/19 1414 98 %  Weight --      Height --      Head Circumference --      Peak Flow --      Pain Score 06/19/19 1415 8     Pain Loc --      Pain Edu? --      Excl. in [place]? --    No data found.  Updated Vital Signs BP 112/76 (BP Location: Right Arm)   Pulse 72   Temp 98.5 F (36.9 C) (Oral)   Resp 16   SpO2 98%     Physical Exam Constitutional:      General: He is not in acute distress.    Appearance: He is well-developed and normal weight.     Comments: No acute distress.  Pleasant and cooperative  HENT:     Head: Normocephalic and atraumatic.     Mouth/Throat:     Comments: Mask is in place Eyes:     Conjunctiva/sclera: Conjunctivae normal.     Pupils: Pupils are equal, round, and reactive to light.  Neck:     Comments: Tenderness in the right upper body trapezius and right paraspinous neck muscles.  Full but slow range of motion.  Strength sensation range of motion and reflexes are normal in both upper extremities. Cardiovascular:      Rate and Rhythm: Normal rate.  Pulmonary:     Effort: Pulmonary effort is normal. No respiratory distress.  Abdominal:     General: There is no distension.     Palpations: Abdomen is soft.  Musculoskeletal:        General: Normal range of motion.     Cervical back: Normal range of motion.  Skin:    General: Skin is warm and dry.  Neurological:     Mental Status: He is alert.     Motor: No weakness.     Coordination: Coordination normal.     Gait: Gait normal.     Deep Tendon Reflexes: Reflexes normal.  Psychiatric:        Mood and Affect: Mood normal.        Behavior: Behavior normal.      UC Treatments / Results  Labs (all labs ordered are listed, but only abnormal results are displayed) Labs Reviewed - No data to display  EKG   Radiology No results found.  Procedures Procedures (including critical care time)  Medications Ordered in UC Medications - No data to display  Initial Impression / Assessment and Plan / UC Course  I have reviewed the triage vital signs and the nursing notes.  Pertinent labs & imaging results that were available during my care of the patient were reviewed by me and considered in my medical decision making (see chart for details).     We will treat conservatively.  Ice or heat to area.  Off work today and tomorrow.  Return to work on Monday. Final Clinical Impressions(s) / UC Diagnoses   Final diagnoses:  Acute strain of neck muscle, initial encounter     Discharge Instructions     Use ice or heat to painful neck muscles Gentle massage may help take ibuprofen 3 times a day with food Take tizanidine as needed muscle relaxer This is helpful at bedtime Return if unable to return to work by next week    ED Prescriptions    Medication Sig Dispense Auth. Provider   ibuprofen (ADVIL) 800 MG tablet Take 1 tablet (800 mg total) by mouth 3 (three) times daily. 21  tablet Eustace Moore, MD   tiZANidine (ZANAFLEX) 4 MG tablet Take  1-2 tablets (4-8 mg total) by mouth every 6 (six) hours as needed for muscle spasms. 21 tablet Eustace Moore, MD     PDMP not reviewed this encounter.   Eustace Moore, MD 06/19/19 [phone]

## 2019-06-19 NOTE — Discharge Instructions (Signed)
Use ice or heat to painful neck muscles Gentle massage may help take ibuprofen 3 times a day with food Take tizanidine as needed muscle relaxer This is helpful at bedtime Return if unable to return to work by next week

## 2019-06-19 NOTE — ED Triage Notes (Signed)
Pt c/o right neck pain radiating downward. Pt states he was recently released from hospital and feels he can't return to work b/c of the neck pain.   Pt reports that he feels dizzy when bending over/down.   Pt denies thoughts/ideas of self-harm or harm to others.

## 2019-06-24 ENCOUNTER — Ambulatory Visit (INDEPENDENT_AMBULATORY_CARE_PROVIDER_SITE_OTHER): Payer: Self-pay | Admitting: Primary Care

## 2019-06-30 ENCOUNTER — Telehealth (INDEPENDENT_AMBULATORY_CARE_PROVIDER_SITE_OTHER): Payer: Self-pay | Admitting: Licensed Clinical Social Worker

## 2019-06-30 ENCOUNTER — Ambulatory Visit (INDEPENDENT_AMBULATORY_CARE_PROVIDER_SITE_OTHER): Payer: Self-pay | Admitting: Licensed Clinical Social Worker

## 2019-06-30 NOTE — Telephone Encounter (Signed)
Call placed to patient regarding scheduled [facility] appointment. Mailbox was full; therefore, LCSW was unable to leave message.

## 2019-08-06 ENCOUNTER — Ambulatory Visit (HOSPITAL_COMMUNITY)
Admission: EM | Admit: 2019-08-06 | Discharge: 2019-08-06 | Disposition: A | Discharge status: Home / Self Care | Payer: Self-pay | Attending: Internal Medicine | Admitting: Internal Medicine

## 2019-08-06 ENCOUNTER — Encounter (HOSPITAL_COMMUNITY): Payer: Self-pay

## 2019-08-06 DIAGNOSIS — S29012A Strain of muscle and tendon of back wall of thorax, initial encounter: Secondary | ICD-10-CM

## 2019-08-06 MED ORDER — KETOROLAC TROMETHAMINE 60 MG/2ML IM SOLN
60.0000 mg | Freq: Once | INTRAMUSCULAR | Status: AC
  Administered 2019-08-06: 60 mg via INTRAMUSCULAR

## 2019-08-06 MED ORDER — KETOROLAC TROMETHAMINE 60 MG/2ML IM SOLN
INTRAMUSCULAR | Status: AC
  Filled 2019-08-06: qty 2

## 2019-08-06 MED ORDER — TIZANIDINE HCL 4 MG PO TABS: 4.0000 mg | ORAL_TABLET | Freq: Four times a day (QID) | ORAL | 0 refills | Status: DC | PRN

## 2019-08-06 MED FILL — tiZANidine HCL 4 MG TABS: 4 | 2 days supply | Qty: 21 | Fill #0

## 2019-08-06 NOTE — ED Triage Notes (Signed)
Pt c/o acute thoracic b/t scapula while lifting/carrying a small dresser approx 45 mint ago.  Pt denies numbness to legs/arms/hands. Pt took two "pills for pain" pta, uncertain what kind.  Denies CP.  Hands with brisk cap refill, +2 radial pulses.

## 2019-08-06 NOTE — ED Provider Notes (Signed)
[facility]    CSN: [id number] Arrival date & time: 08/06/19  1103      History   Chief Complaint Chief Complaint  Patient presents with  . Back Pain    HPI Patrick Solomon is a 49 y.o. male with past medical history of hypertension, depression, cocaine abuse presents with complaints of back pain.  Patient states he was lifting a heavy dresser at work when he experienced sudden onset of upper back pain.  Of note, patient last seen in urgent care approximately 1 month ago for complaints of neck pain.  He states he never picked up prescribed muscle relaxer from pharmacy.  Patient states pain worse with any movement and deep inspiration.  He denies any chest pain, shortness of breath, palpitations, no numbness or tingling.  Past Medical History:  Diagnosis Date  . Asthma   . Back pain   . Depression   . Hypertension   . Sciatica     Patient Active Problem List   Diagnosis Date Noted  . Cocaine abuse (HCC) 06/17/2019  . Substance induced mood disorder ([facility]) 06/17/2019    History reviewed. No pertinent surgical history.    Home Medications    Prior to Admission medications   Medication Sig Start Date End Date Taking? Authorizing Provider  FLUoxetine (PROZAC) 20 MG tablet Take 1 tablet (20 mg total) by mouth daily. 05/26/19  Yes Grayce Sessions, NP  lisinopril-hydrochlorothiazide (ZESTORETIC) 20-25 MG tablet Take 1 tablet by mouth daily. 05/26/19  Yes Grayce Sessions, NP  ibuprofen (ADVIL) 800 MG tablet Take 1 tablet (800 mg total) by mouth 3 (three) times daily. 06/19/19   Eustace Moore, MD  naproxen (NAPROSYN) 500 MG tablet Take 500 mg by mouth 2 (two) times daily with a meal.    [provider]  tiZANidine (ZANAFLEX) 4 MG tablet Take 1-2 tablets (4-8 mg total) by mouth every 6 (six) hours as needed for muscle spasms. 08/06/19   Rolla Etienne, NP    Family History Family History  Problem Relation Age of Onset  . Alcohol abuse Mother   .  Cirrhosis Mother   . Cancer Mother        unknown type cancer  . Hypertension Father     Social History Social History   Tobacco Use  . Smoking status: Current Every Day Smoker    Packs/day: 0.50    Years: 29.00    Pack years: 14.50    Types: Cigarettes  . Smokeless tobacco: Never Used  . Tobacco comment: Information for Quitline given  Vaping Use  . Vaping Use: Never used  Substance Use Topics  . Alcohol use: Yes    Comment: States drinks once weekly on weekend-beer  . Drug use: No     Allergies   Food   Review of Systems As stated in HPI otherwise negative   Physical Exam Triage Vital Signs ED Triage Vitals  Enc Vitals Group     BP 08/06/19 1121 129/81     Pulse Rate 08/06/19 1121 91     Resp 08/06/19 1121 17     Temp 08/06/19 1121 98 F (36.7 C)     Temp Source 08/06/19 1121 Oral     SpO2 08/06/19 1121 99 %     Weight --      Height --      Head Circumference --      Peak Flow --      Pain Score 08/06/19 1119 10  Pain Loc --      Pain Edu? --      Excl. in [place]? --    No data found.  Updated Vital Signs BP 129/81 (BP Location: Right Arm)   Pulse 91   Temp 98 F (36.7 C) (Oral)   Resp 17   SpO2 99%   Physical Exam Constitutional:      Appearance: Normal appearance.  HENT:     Head: Normocephalic and atraumatic.  Musculoskeletal:        General: Tenderness present. No deformity. Normal range of motion.     Cervical back: Normal range of motion and neck supple.     Comments: Tenderness upon palpation of right paraspinal region at latissimus muscle.  NT along thoracic spine  Neurological:     General: No focal deficit present.     Mental Status: He is alert and oriented to person, place, and time.     Motor: No weakness.  Psychiatric:        Mood and Affect: Mood normal.        Behavior: Behavior normal.      UC Treatments / Results  Labs (all labs ordered are listed, but only abnormal results are displayed) Labs Reviewed - No  data to display  EKG   Radiology No results found.  Procedures Procedures (including critical care time)  Medications Ordered in UC Medications  ketorolac (TORADOL) injection 60 mg (60 mg Intramuscular Given 08/06/19 1147)    Initial Impression / Assessment and Plan / UC Course  I have reviewed the triage vital signs and the nursing notes.  Pertinent labs & imaging results that were available during my care of the patient were reviewed by me and considered in my medical decision making (see chart for details).  Muscle strain, latissimus muscle -Due to heavy lifting -Heat, NSAIDs, muscle relaxers as needed -Follow-up as needed  Final Clinical Impressions(s) / UC Diagnoses   Final diagnoses:  Strain of latissimus dorsi muscle, initial encounter     Discharge Instructions     Take Motrin as needed for pain up to 800mg  every 8 hours as needed. Your can take muscle relaxer for muscle relaxer as needed for muscle spasms. This medication can make you sleepy. No driving or operating machinery while taking. Heating pad or warm soaks will be helpful.    ED Prescriptions    Medication Sig Dispense Auth. Provider   tiZANidine (ZANAFLEX) 4 MG tablet Take 1-2 tablets (4-8 mg total) by mouth every 6 (six) hours as needed for muscle spasms. 21 tablet , NP     PDMP not reviewed this encounter.   Rolla Etienne, NP 08/06/19 1159

## 2019-08-06 NOTE — Discharge Instructions (Addendum)
Take Motrin as needed for pain up to 800mg  every 8 hours as needed. Your can take muscle relaxer for muscle relaxer as needed for muscle spasms. This medication can make you sleepy. No driving or operating machinery while taking. Heating pad or warm soaks will be helpful.

## 2020-07-20 ENCOUNTER — Ambulatory Visit (INDEPENDENT_AMBULATORY_CARE_PROVIDER_SITE_OTHER): Payer: Self-pay | Admitting: Primary Care

## 2020-08-09 ENCOUNTER — Other Ambulatory Visit: Payer: Self-pay

## 2020-08-09 ENCOUNTER — Encounter (INDEPENDENT_AMBULATORY_CARE_PROVIDER_SITE_OTHER): Payer: Self-pay | Admitting: Primary Care

## 2020-08-09 ENCOUNTER — Ambulatory Visit (INDEPENDENT_AMBULATORY_CARE_PROVIDER_SITE_OTHER): Payer: Self-pay | Admitting: Primary Care

## 2020-08-09 VITALS — BP 122/79 | HR 121 | Temp 97.5°F | Ht 68.0 in | Wt 136.2 lb

## 2020-08-09 DIAGNOSIS — M5440 Lumbago with sciatica, unspecified side: Secondary | ICD-10-CM

## 2020-08-09 NOTE — Progress Notes (Signed)
[facility]  Subjective: CC: back pain PCP: Grayce Sessions, NP HPI: Patient is a 50 y.o. male presenting to clinic today for back pain. Concerns today include:  1. Back Pain Patient reports that pain began 2009.  Trauma lifting weights h/o back pain.  Pain is a 10/10.  It  radiate everywhere .  Moving, bending or grabbing something worsens pain.  Nothing (do not move) improves pain.  Patient has been taking tylenol/ibuprofen  for pain with no relief.  Denies dysuria, hematuria, fevers, chills, nausea, vomiting, abdominal pain, renal stones. Aggravating factors: certain movements and prolonged walking/standing. Alleviating factors: rest. Progressive LE weakness or saddle anesthesia: none. Extremity sensation changes or weakness: none. Ambulatory without difficulty. Normal bowel/bladder habits: yes; without urinary retention. Normal PO intake without n/v. No associated abdominal pain/n/v. Self treatment: has OTC analgesics, with minimal relief. Patient denies: Denies urinary retention/incontinence, bowel incontinence, weakness, falls, sensation changes or pain anywhere else. No h/o back surgeries.    Current Outpatient Medications:    FLUoxetine (PROZAC) 20 MG tablet, Take 1 tablet (20 mg total) by mouth daily. (Patient not taking: Reported on 08/09/2020), Disp: 90 tablet, Rfl: 1   lisinopril-hydrochlorothiazide (ZESTORETIC) 20-25 MG tablet, Take 1 tablet by mouth daily. (Patient not taking: Reported on 08/09/2020), Disp: 30 tablet, Rfl: 3   naproxen (NAPROSYN) 500 MG tablet, Take 500 mg by mouth 2 (two) times daily with a meal. (Patient not taking: Reported on 08/09/2020), Disp: , Rfl:    tiZANidine (ZANAFLEX) 4 MG tablet, Take 1-2 tablets (4-8 mg total) by mouth every 6 (six) hours as needed for muscle spasms. (Patient not taking: Reported on 08/09/2020), Disp: 21 tablet, Rfl: 0 Allergies  Allergen Reactions   Food     Pickles/Peanuts/Tuna Fish---Rash/Spots on body    Past  Medical History:  Diagnosis Date   Asthma    Back pain    Depression    Hypertension    Sciatica    Social History   Socioeconomic History   Marital status: Significant Other    Spouse name: Not on file   Number of children: 12   Years of education: 12   Highest education level: High school graduate  Occupational History   Not on file  Tobacco Use   Smoking status: Every Day    Packs/day: 0.50    Years: 29.00    Pack years: 14.50    Types: Cigarettes   Smokeless tobacco: Never   Tobacco comments:    Information for Quitline given  Vaping Use   Vaping Use: Never used  Substance and Sexual Activity   Alcohol use: Yes    Comment: States drinks once weekly on weekend-beer   Drug use: No   Sexual activity: Not on file  Other Topics Concern   Not on file  Social History Narrative   Not on file   Social Determinants of Health   Financial Resource Strain: Not on file  Food Insecurity: Not on file  Transportation Needs: Not on file  Physical Activity: Not on file  Stress: Not on file  Social Connections: Not on file  Intimate Partner Violence: Not on file   No past surgical history on file.  ROS: per HPI  Objective: Office vital signs reviewed. BP 122/79 (BP Location: Right Arm, Patient Position: Sitting, Cuff Size: Normal)   Pulse (!) 121   Temp (!) 97.5 F (36.4 C) (Temporal)   Ht 5\' 8"  (1.727 m)   Wt 136 lb 3.2 oz (61.8 kg)  SpO2 94%   BMI 20.71 kg/m   Physical Examination:  General: Awake, alert, thin frame nourished, NAD Cardio: Regular rate and rhythm, S1S2 heard, no murmurs appreciated Pulm: Clear to auscultation bilaterally, no wheezes, rhonchi or rales Extremities: Warm, well-perfused. No edema, cyanosis or clubbing; +5 pulses bilaterally MSK: normal gait and station  No abnormality Spine: decrease AROM, positive midline tenderness to palpation, positive  paraspinal tenderness to palpation.  No palpable bony deformities,  negative straight leg  test Neuro: 5/5 lower extremity strength; lower extremity light touch sensation grossly intact, positive heel walk, Toe Walk and Tandem Walk states pain comes and goes . Today no numbness  Assessment/ Plan: Patrick Solomon was seen today for back pain and referral.  Diagnoses and all orders for this visit:  Low back pain with sciatica, sciatica laterality unspecified, unspecified back pain laterality, unspecified chronicity States swollen and painful  -     Ambulatory referral to Orthopedic Surgery    The above assessment and management plan was discussed with the patient. The patient verbalized understanding of and has agreed to the management plan. Patient is aware to call the clinic if symptoms persist or worsen. Patient is aware when to return to the clinic for a follow-up visit. Patient educated on when it is appropriate to go to the emergency department.   This note has been created with Education officer, environmental. Any transcriptional errors are unintentional.   Grayce Sessions, NP 08/09/2020, 4:28 PM

## 2020-08-12 ENCOUNTER — Other Ambulatory Visit (INDEPENDENT_AMBULATORY_CARE_PROVIDER_SITE_OTHER): Payer: Self-pay | Admitting: Primary Care

## 2020-08-12 NOTE — Telephone Encounter (Signed)
Medication Refill - Medication: lisinopril-hydrochlorothiazide fluoxetine, naproxen, tizanidine  Has the patient contacted their pharmacy? Yes. Pts fiancee states that she has contacted the pharmacy and that they have none of these medications on file. Please advise.  (Agent: If no, request that the patient contact the pharmacy for the refill.) (Agent: If yes, when and what did the pharmacy advise?)  Preferred Pharmacy (with phone number or street name):  [facility]  [address] [place] [address]  Phone: [phone] Fax: [phone]  Hours: M-F 8:30a-5:30p   Agent: Please be advised that RX refills may take up to 3 business days. We ask that you follow-up with your pharmacy.

## 2020-08-12 NOTE — Telephone Encounter (Signed)
Notes to clinic:  Patient had appointment on 08/09/2020 Review for refills   Requested Prescriptions  Pending Prescriptions Disp Refills   lisinopril-hydrochlorothiazide (ZESTORETIC) 20-25 MG tablet 30 tablet 3    Sig: Take 1 tablet by mouth daily.      Cardiovascular:  ACEI + Diuretic Combos Failed - 08/12/2020 10:53 AM      Failed - Na in normal range and within 180 days    Sodium  Date Value Ref Range Status  06/16/2019 147 (H) 135 - 145 mmol/L Final  04/13/2019 141 134 - 144 mmol/L Final          Failed - K in normal range and within 180 days    Potassium  Date Value Ref Range Status  06/16/2019 3.8 3.5 - 5.1 mmol/L Final          Failed - Cr in normal range and within 180 days    Creatinine, Ser  Date Value Ref Range Status  06/16/2019 1.28 (H) 0.61 - 1.24 mg/dL Final          Failed - Ca in normal range and within 180 days    Calcium  Date Value Ref Range Status  06/16/2019 8.9 8.9 - 10.3 mg/dL Final          Passed - Patient is not pregnant      Passed - Last BP in normal range    BP Readings from Last 1 Encounters:  08/09/20 122/79          Passed - Valid encounter within last 6 months    Recent Outpatient Visits           3 days ago Low back pain with sciatica, sciatica laterality unspecified, unspecified back pain laterality, unspecified chronicity   [facility] Grayce Sessions, NP   1 year ago OSA (obstructive sleep apnea)   [facility] Grayce Sessions, NP   1 year ago Encounter to establish care   [facility] Gwinda Passe P, NP                  FLUoxetine (PROZAC) 20 MG tablet 90 tablet 1    Sig: Take 1 tablet (20 mg total) by mouth daily.      Psychiatry:  Antidepressants - SSRI Passed - 08/12/2020 10:53 AM      Passed - Valid encounter within last 6 months    Recent Outpatient Visits           3 days ago Low back pain with sciatica, sciatica  laterality unspecified, unspecified back pain laterality, unspecified chronicity   [facility] Grayce Sessions, NP   1 year ago OSA (obstructive sleep apnea)   [facility] Grayce Sessions, NP   1 year ago Encounter to establish care   [facility] Grayce Sessions, NP                  naproxen (NAPROSYN) 500 MG tablet      Sig: Take 1 tablet (500 mg total) by mouth 2 (two) times daily with a meal.      Analgesics:  NSAIDS Failed - 08/12/2020 10:53 AM      Failed - Cr in normal range and within 360 days    Creatinine, Ser  Date Value Ref Range Status  06/16/2019 1.28 (H) 0.61 - 1.24 mg/dL Final  Failed - HGB in normal range and within 360 days    Hemoglobin  Date Value Ref Range Status  06/16/2019 14.8 13.0 - 17.0 g/dL Final  84/66/5993 57.0 13.0 - 17.7 g/dL Final          Passed - Patient is not pregnant      Passed - Valid encounter within last 12 months    Recent Outpatient Visits           3 days ago Low back pain with sciatica, sciatica laterality unspecified, unspecified back pain laterality, unspecified chronicity   [facility] Gwinda Passe P, NP   1 year ago OSA (obstructive sleep apnea)   [facility] Grayce Sessions, NP   1 year ago Encounter to establish care   [facility] Gwinda Passe P, NP                  tiZANidine (ZANAFLEX) 4 MG tablet 21 tablet 0    Sig: Take 1-2 tablets (4-8 mg total) by mouth every 6 (six) hours as needed for muscle spasms.      Not Delegated - Cardiovascular:  Alpha-2 Agonists - tizanidine Failed - 08/12/2020 10:53 AM      Failed - This refill cannot be delegated      Passed - Valid encounter within last 6 months    Recent Outpatient Visits           3 days ago Low back pain with sciatica, sciatica laterality unspecified, unspecified back pain  laterality, unspecified chronicity   [facility] Grayce Sessions, NP   1 year ago OSA (obstructive sleep apnea)   [facility] Grayce Sessions, NP   1 year ago Encounter to establish care   [facility] Grayce Sessions, NP

## 2020-08-14 ENCOUNTER — Other Ambulatory Visit (INDEPENDENT_AMBULATORY_CARE_PROVIDER_SITE_OTHER): Payer: Self-pay | Admitting: Primary Care

## 2020-08-14 MED ORDER — TIZANIDINE HCL 4 MG PO TABS
4.0000 mg | ORAL_TABLET | Freq: Four times a day (QID) | ORAL | 0 refills | Status: DC | PRN
  Filled 2020-08-14: qty 21, 3d supply, fill #0

## 2020-08-14 MED ORDER — NAPROXEN 500 MG PO TABS
500.0000 mg | ORAL_TABLET | Freq: Two times a day (BID) | ORAL | 1 refills | Status: DC
  Filled 2020-08-14: qty 60, 30d supply, fill #0

## 2020-08-14 MED ORDER — LISINOPRIL-HYDROCHLOROTHIAZIDE 20-25 MG PO TABS
1.0000 | ORAL_TABLET | Freq: Every day | ORAL | 3 refills | Status: DC
  Filled 2020-08-14: qty 30, 30d supply, fill #0
  Filled 2020-11-28: qty 30, 30d supply, fill #1

## 2020-08-14 MED ORDER — FLUOXETINE HCL 20 MG PO CAPS
20.0000 mg | ORAL_CAPSULE | Freq: Every day | ORAL | 1 refills | Status: DC
  Filled 2020-08-14: qty 30, 30d supply, fill #0
  Filled 2020-11-28: qty 30, 30d supply, fill #1

## 2020-08-15 ENCOUNTER — Other Ambulatory Visit: Payer: Self-pay

## 2020-08-19 ENCOUNTER — Other Ambulatory Visit: Payer: Self-pay

## 2020-08-19 ENCOUNTER — Ambulatory Visit (INDEPENDENT_AMBULATORY_CARE_PROVIDER_SITE_OTHER): Payer: Self-pay | Admitting: Family Medicine

## 2020-08-19 ENCOUNTER — Encounter: Payer: Self-pay | Admitting: Family Medicine

## 2020-08-19 DIAGNOSIS — G8929 Other chronic pain: Secondary | ICD-10-CM

## 2020-08-19 DIAGNOSIS — M545 Low back pain, unspecified: Secondary | ICD-10-CM

## 2020-08-19 MED ORDER — BACLOFEN 10 MG PO TABS
5.0000 mg | ORAL_TABLET | Freq: Three times a day (TID) | ORAL | 3 refills | Status: DC | PRN
  Filled 2020-08-19 – 2020-11-28 (×2): qty 30, 10d supply, fill #0
  Filled 2020-12-16: qty 30, 10d supply, fill #1

## 2020-08-19 NOTE — Progress Notes (Signed)
Office Visit Note   Patient: Patrick Solomon           Date of Birth: 18-May-1970           MRN: [id number] Visit Date: 08/19/2020 Requested by: Grayce Sessions, NP [address] [place],  [place] [address] PCP: Grayce Sessions, NP  Subjective: Chief Complaint  Patient presents with   Lower Back - Pain    Chronic pain in the lower back x over 5 years. This started with doing dead lifts -- felt "an explosion" in the back with one of the lifts. Pain moves from the center of the lower back to the right and the left. Occasionally has pain up to between the shoulder blades. Numbness in both legs, down to the feet (intermittent).    HPI: He is here with low back pain.  More than 5 years ago he was doing dead lifts and felt something horribly painful in his back.  He states that he had to use a walker for about a month.  The pain improved very slightly, but never went away.  He goes down the left leg intermittently and sometimes both legs feel numb.  Pain is worse when bending forward or sitting for a long time, better when lying down.  Denies bowel or bladder dysfunction.  He has been trying Zanaflex recently with no improvement.  He has not been to a chiropractor or a physical therapist.               ROS:   All other systems were reviewed and are negative.  Objective: Vital Signs: There were no vitals taken for this visit.  Physical Exam:  General:  Alert and oriented, in no acute distress. Pulm:  Breathing unlabored. Psy:  Normal mood, congruent affect.  Low back: No scoliosis.  He has limited forward flexion and hyperextension.  He is tender to palpation in the upper lumbar paraspinous muscles.  Straight leg raise is negative, lower extremity strength and reflexes are normal.  Imaging: No results found.  Assessment & Plan: Chronic low back pain with history concerning for lumbar disc protrusion -Patient states that he is at a point where he would like to consider surgery  if that is indicated.  We will order MRI scan to further evaluate.  Baclofen as needed.     Procedures: No procedures performed        PMFS History: Patient Active Problem List   Diagnosis Date Noted   Cocaine abuse (HCC) 06/17/2019   Substance induced mood disorder (HCC) 06/17/2019   Past Medical History:  Diagnosis Date   Asthma    Back pain    Depression    Hypertension    Sciatica     Family History  Problem Relation Age of Onset   Alcohol abuse Mother    Cirrhosis Mother    Cancer Mother        unknown type cancer   Hypertension Father     History reviewed. No pertinent surgical history. Social History   Occupational History   Not on file  Tobacco Use   Smoking status: Every Day    Packs/day: 0.50    Years: 29.00    Pack years: 14.50    Types: Cigarettes   Smokeless tobacco: Never   Tobacco comments:    Information for Quitline given  Vaping Use   Vaping Use: Never used  Substance and Sexual Activity   Alcohol use: Yes    Comment: States drinks once  weekly on weekend-beer   Drug use: No   Sexual activity: Not on file

## 2020-08-26 ENCOUNTER — Other Ambulatory Visit: Payer: Self-pay

## 2020-09-14 ENCOUNTER — Ambulatory Visit (INDEPENDENT_AMBULATORY_CARE_PROVIDER_SITE_OTHER): Payer: Self-pay | Admitting: Family Medicine

## 2020-09-14 ENCOUNTER — Other Ambulatory Visit: Payer: Self-pay

## 2020-09-14 DIAGNOSIS — G8929 Other chronic pain: Secondary | ICD-10-CM

## 2020-09-14 DIAGNOSIS — M545 Low back pain, unspecified: Secondary | ICD-10-CM

## 2020-09-14 NOTE — Progress Notes (Signed)
   Office Visit Note   Patient: Patrick Solomon           Date of Birth: [birth year]           MRN: [id number] Visit Date: 09/14/2020 Requested by: Grayce Sessions, NP [address] [place],  [place] [address] PCP: Grayce Sessions, NP  Subjective: Chief Complaint  Patient presents with   Lower Back - Pain    No improvement with medication from last ov. Has not had MRI yet - he now has [facility] (had no insurance at last ov).    HPI: He is here for follow-up chronic low back pain.  Since last visit there have been no changes in his symptoms.  He continues to complain of bilateral lumbosacral pain with occasional radiation into the left leg.  There is significantly limits his activities.  He has not yet had an MRI scan.                ROS:   All other systems were reviewed and are negative.  Objective: Vital Signs: There were no vitals taken for this visit.  Physical Exam:  General:  Alert and oriented, in no acute distress. Pulm:  Breathing unlabored. Psy:  Normal mood, congruent affect  Low back: He is tender bilaterally to palpation of the lower lumbar paraspinous muscles.  No significant sciatic notch pain.  Straight leg raise is equivocal.  Lower extremity strength and reflexes remain normal.  Imaging: No results found.  Assessment & Plan: Chronic low back pain, question lumbar disc protrusion -We will await MRI results.  Depending on results, could contemplate epidural injection or possibly chiropractic or physical therapy.     Procedures: No procedures performed        PMFS History: Patient Active Problem List   Diagnosis Date Noted   Cocaine abuse (HCC) 06/17/2019   Substance induced mood disorder (HCC) 06/17/2019   Past Medical History:  Diagnosis Date   Asthma    Back pain    Depression    Hypertension    Sciatica     Family History  Problem Relation Age of Onset   Alcohol abuse Mother    Cirrhosis Mother    Cancer Mother         unknown type cancer   Hypertension Father     No past surgical history on file. Social History   Occupational History   Not on file  Tobacco Use   Smoking status: Every Day    Packs/day: 0.50    Years: 29.00    Pack years: 14.50    Types: Cigarettes   Smokeless tobacco: Never   Tobacco comments:    Information for Quitline given  Vaping Use   Vaping Use: Never used  Substance and Sexual Activity   Alcohol use: Yes    Comment: States drinks once weekly on weekend-beer   Drug use: No   Sexual activity: Not on file

## 2020-09-29 ENCOUNTER — Ambulatory Visit (INDEPENDENT_AMBULATORY_CARE_PROVIDER_SITE_OTHER): Payer: Self-pay | Admitting: Primary Care

## 2020-10-07 ENCOUNTER — Other Ambulatory Visit: Payer: Self-pay

## 2020-11-28 ENCOUNTER — Other Ambulatory Visit: Payer: Self-pay

## 2020-11-30 ENCOUNTER — Other Ambulatory Visit: Payer: Self-pay

## 2020-12-16 ENCOUNTER — Other Ambulatory Visit: Payer: Self-pay

## 2020-12-23 ENCOUNTER — Other Ambulatory Visit: Payer: Self-pay

## 2021-01-05 ENCOUNTER — Emergency Department (HOSPITAL_COMMUNITY)
Admission: EM | Admit: 2021-01-05 | Discharge: 2021-01-06 | Disposition: A | Discharge status: Home / Self Care | Payer: 59 | Attending: Emergency Medicine | Admitting: Emergency Medicine

## 2021-01-05 ENCOUNTER — Encounter (HOSPITAL_COMMUNITY): Payer: Self-pay | Admitting: Emergency Medicine

## 2021-01-05 DIAGNOSIS — M549 Dorsalgia, unspecified: Secondary | ICD-10-CM | POA: Insufficient documentation

## 2021-01-05 DIAGNOSIS — G8929 Other chronic pain: Secondary | ICD-10-CM | POA: Diagnosis not present

## 2021-01-05 DIAGNOSIS — Z5321 Procedure and treatment not carried out due to patient leaving prior to being seen by health care provider: Secondary | ICD-10-CM | POA: Diagnosis not present

## 2021-01-05 NOTE — ED Notes (Signed)
Pt called for X. Pt could not be found.

## 2021-01-05 NOTE — ED Notes (Signed)
Pt is refusing vital signs while out in the lobby. Pt states that he came in an ambulance and should have went straight to the back. Pt and family member educated on ED process.

## 2021-01-05 NOTE — ED Triage Notes (Signed)
Patient with history of sciatica BIB [facility] complains of worsening chronic back pain after running out of his muscle relaxer prescription. Patient alert, oriented, ambulatory and in no apparent distress at this time.  EMS Vitals 114/70 HR 64 RR 14 100% on room air

## 2021-01-06 NOTE — ED Notes (Signed)
Patient seen walking out ED and getting into car to leave

## 2021-01-17 ENCOUNTER — Ambulatory Visit (INDEPENDENT_AMBULATORY_CARE_PROVIDER_SITE_OTHER): Payer: Self-pay | Admitting: Primary Care

## 2021-01-23 ENCOUNTER — Ambulatory Visit (INDEPENDENT_AMBULATORY_CARE_PROVIDER_SITE_OTHER): Payer: Self-pay | Admitting: Primary Care

## 2023-01-14 ENCOUNTER — Ambulatory Visit (INDEPENDENT_AMBULATORY_CARE_PROVIDER_SITE_OTHER): Payer: Self-pay | Admitting: *Deleted

## 2023-01-14 ENCOUNTER — Encounter: Payer: Self-pay | Admitting: Physician Assistant

## 2023-01-14 ENCOUNTER — Ambulatory Visit: Payer: 59 | Admitting: Physician Assistant

## 2023-01-14 VITALS — BP 113/88 | HR 128 | Temp 100.0°F | Wt 150.6 lb

## 2023-01-14 DIAGNOSIS — F17210 Nicotine dependence, cigarettes, uncomplicated: Secondary | ICD-10-CM

## 2023-01-14 DIAGNOSIS — F17200 Nicotine dependence, unspecified, uncomplicated: Secondary | ICD-10-CM

## 2023-01-14 DIAGNOSIS — I10 Essential (primary) hypertension: Secondary | ICD-10-CM

## 2023-01-14 DIAGNOSIS — J069 Acute upper respiratory infection, unspecified: Secondary | ICD-10-CM | POA: Diagnosis not present

## 2023-01-14 MED ORDER — ALBUTEROL SULFATE HFA 108 (90 BASE) MCG/ACT IN AERS: 2.0000 | INHALATION_SPRAY | Freq: Four times a day (QID) | RESPIRATORY_TRACT | 0 refills | Status: DC | PRN

## 2023-01-14 MED ORDER — PREDNISONE 20 MG PO TABS: ORAL_TABLET | ORAL | 0 refills | Status: AC

## 2023-01-14 MED ORDER — ACETAMINOPHEN 325 MG PO TABS
975.0000 mg | ORAL_TABLET | Freq: Once | ORAL | Status: AC
  Administered 2023-01-14: 975 mg via ORAL

## 2023-01-14 MED ORDER — AZITHROMYCIN 250 MG PO TABS: ORAL_TABLET | ORAL | 0 refills | Status: DC

## 2023-01-14 MED ORDER — BENZONATATE 100 MG PO CAPS: ORAL_CAPSULE | ORAL | 0 refills | Status: DC

## 2023-01-14 NOTE — Patient Instructions (Signed)
You are going to take azithromycin, a prednisone taper, you can use Tessalon Perles as directed to help with your cough, and I also sent an albuterol inhaler that you can use as needed to help with your shortness of breath.  Please return to the mobile unit in 2 weeks for a full physical.  Roney Jaffe, PA-C Physician Assistant [facility] [email or web address]   Upper Respiratory Infection, Adult An upper respiratory infection (URI) is a common viral infection of the nose, throat, and upper air passages that lead to the lungs. The most common type of URI is the common cold. URIs usually get better on their own, without medical treatment. What are the causes? A URI is caused by a virus. You may catch a virus by: Breathing in droplets from an infected person's cough or sneeze. Touching something that has been exposed to the virus (is contaminated) and then touching your mouth, nose, or eyes. What increases the risk? You are more likely to get a URI if: You are very young or very old. You have close contact with others, such as at work, school, or a health care facility. You smoke. You have long-term (chronic) heart or lung disease. You have a weakened disease-fighting system (immune system). You have nasal allergies or asthma. You are experiencing a lot of stress. You have poor nutrition. What are the signs or symptoms? A URI usually involves some of the following symptoms: Runny or stuffy (congested) nose. Cough. Sneezing. Sore throat. Headache. Fatigue. Fever. Loss of appetite. Pain in your forehead, behind your eyes, and over your cheekbones (sinus pain). Muscle aches. Redness or irritation of the eyes. Pressure in the ears or face. How is this diagnosed? This condition may be diagnosed based on your medical history and symptoms, and a physical exam. Your health care provider may use a swab to take a mucus sample from  your nose (nasal swab). This sample can be tested to determine what virus is causing the illness. How is this treated? URIs usually get better on their own within 7-10 days. Medicines cannot cure URIs, but your health care provider may recommend certain medicines to help relieve symptoms, such as: Over-the-counter cold medicines. Cough suppressants. Coughing is a type of defense against infection that helps to clear the respiratory system, so take these medicines only as recommended by your health care provider. Fever-reducing medicines. Follow these instructions at home: Activity Rest as needed. If you have a fever, stay home from work or school until your fever is gone or until your health care provider says your URI cannot spread to other people (is no longer contagious). Your health care provider may have you wear a face mask to prevent your infection from spreading. Relieving symptoms Gargle with a mixture of salt and water 3-4 times a day or as needed. To make salt water, completely dissolve -1 tsp (3-6 g) of salt in 1 cup (237 mL) of warm water. Use a cool-mist humidifier to add moisture to the air. This can help you breathe more easily. Eating and drinking  Drink enough fluid to keep your urine pale yellow. Eat soups and other clear broths. General instructions  Take over-the-counter and prescription medicines only as told by your health care provider. These include cold medicines, fever reducers, and cough suppressants. Do not use any products that contain nicotine or tobacco. These products include cigarettes, chewing tobacco, and vaping devices, such as e-cigarettes. If you need help quitting, ask your health care provider. Stay  away from secondhand smoke. Stay up to date on all immunizations, including the yearly (annual) flu vaccine. Keep all follow-up visits. This is important. How to prevent the spread of infection to others URIs can be contagious. To prevent the infection  from spreading: Wash your hands with soap and water for at least 20 seconds. If soap and water are not available, use hand sanitizer. Avoid touching your mouth, face, eyes, or nose. Cough or sneeze into a tissue or your sleeve or elbow instead of into your hand or into the air.  Contact a health care provider if: You are getting worse instead of better. You have a fever or chills. Your mucus is brown or red. You have yellow or brown discharge coming from your nose. You have pain in your face, especially when you bend forward. You have swollen neck glands. You have pain while swallowing. You have white areas in the back of your throat. Get help right away if: You have shortness of breath that gets worse. You have severe or persistent: Headache. Ear pain. Sinus pain. Chest pain. You have chronic lung disease along with any of the following: Making high-pitched whistling sounds when you breathe, most often when you breathe out (wheezing). Prolonged cough (more than 14 days). Coughing up blood. A change in your usual mucus. You have a stiff neck. You have changes in your: Vision. Hearing. Thinking. Mood. These symptoms may be an emergency. Get help right away. Call 911. Do not wait to see if the symptoms will go away. Do not drive yourself to the hospital. Summary An upper respiratory infection (URI) is a common infection of the nose, throat, and upper air passages that lead to the lungs. A URI is caused by a virus. URIs usually get better on their own within 7-10 days. Medicines cannot cure URIs, but your health care provider may recommend certain medicines to help relieve symptoms. This information is not intended to replace advice given to you by your health care provider. Make sure you discuss any questions you have with your health care provider. Document Revised: 08/24/2020 Document Reviewed: 08/24/2020 Elsevier Patient Education  2024 [employer].

## 2023-01-14 NOTE — Telephone Encounter (Signed)
  Chief Complaint: coughing SOB with coughing  Symptoms:  coughing, yellow sputum, SOB with coughing  Frequency: 2-3 days Pertinent Negatives: Patient denies na  Disposition: [] ED /[] Urgent Care (no appt availability in office) / [] Appointment(In office/virtual)/ []  [facility]/ [] Home Care/ [] Refused Recommended Disposition /[x] [facility]/ []  Follow-up with PCP Additional Notes: no available appt today . Recommended mobile unit and gave address. Recommended warm liquids , cough drops for cough at this time.  Summary: Sneezing, Coughing, Back Pain Advice   Pt calling to report sneezing, coughing, and back pain. No available appts. Please advise           Reason for Disposition  [1] MILD difficulty breathing (e.g., minimal/no SOB at rest, SOB with walking, pulse <100) AND [2] still present when not coughing  Answer Assessment - Initial Assessment Questions 1. ONSET: "When did the cough begin?"      2-3 days ago  2. SEVERITY: "How bad is the cough today?"      Causes SOB 3. SPUTUM: "Describe the color of your sputum" (none, dry cough; clear, white, yellow, green)     Yellow  4. HEMOPTYSIS: "Are you coughing up any blood?" If so ask: "How much?" (flecks, streaks, tablespoons, etc.)     Na  5. DIFFICULTY BREATHING: "Are you having difficulty breathing?" If Yes, ask: "How bad is it?" (e.g., mild, moderate, severe)    - MILD: No SOB at rest, mild SOB with walking, speaks normally in sentences, can lie down, no retractions, pulse < 100.    - MODERATE: SOB at rest, SOB with minimal exertion and prefers to sit, cannot lie down flat, speaks in phrases, mild retractions, audible wheezing, pulse 100-120.    - SEVERE: Very SOB at rest, speaks in single words, struggling to breathe, sitting hunched forward, retractions, pulse > 120      SOB with coughing and speaking  6. FEVER: "Do you have a fever?" If Yes, ask: "What is your temperature, how was it measured, and when did  it start?"     Na  7. CARDIAC HISTORY: "Do you have any history of heart disease?" (e.g., heart attack, congestive heart failure)      Na  8. LUNG HISTORY: "Do you have any history of lung disease?"  (e.g., pulmonary embolus, asthma, emphysema)     Na  9. PE RISK FACTORS: "Do you have a history of blood clots?" (or: recent major surgery, recent prolonged travel, bedridden)     Na  10. OTHER SYMPTOMS: "Do you have any other symptoms?" (e.g., runny nose, wheezing, chest pain)       Constant coughing, SOB with coughing 11. PREGNANCY: "Is there any chance you are pregnant?" "When was your last menstrual period?"       Na  12. TRAVEL: "Have you traveled out of the country in the last month?" (e.g., travel history, exposures)       Na  Protocols used: Cough - Acute Productive-A-AH

## 2023-01-14 NOTE — Telephone Encounter (Signed)
Pt hasn't been seen in 2 years.FYI

## 2023-01-14 NOTE — Progress Notes (Unsigned)
New Patient Office Visit  Subjective    Patient ID: Patrick Solomon, male    DOB: 1970/06/14  Age: 52 y.o. MRN: [id number]  CC:  Chief Complaint  Patient presents with   Generalized Body Aches    X3 days with no relief    Diarrhea    X3 days with no relief    Emesis    X3 days with no relief    Chills    X3 days with no relief     HPI Patrick Solomon states that he has been experiencing a productive cough with yellow sputum, generalized bodyaches, and chills for the past 3 days.  States that he started experiencing a few episodes of vomiting yesterday, states he has not had any further vomiting episodes today, but did have an episode of diarrhea today.  States that he is eating and drinking okay.  Denies sick contacts.  States that he has been using NyQuil and cough drops without relief.    States that he has also been experiencing shortness of breath, but does endorse that he "stays short of breath" states that he is a daily smoker.  States that he has not had his blood pressure medication in the past month, states that he is unsure of which medication he was taking, states that he just got out of prison 2 months ago and was treated for hypertension while incarcerated.  Does not check his blood pressure at home.      Outpatient Encounter Medications as of 01/14/2023  Medication Sig   albuterol (VENTOLIN HFA) 108 (90 Base) MCG/ACT inhaler Inhale 2 puffs into the lungs every 6 (six) hours as needed for wheezing or shortness of breath.   azithromycin (ZITHROMAX) 250 MG tablet Take 2 tabs PO day 1, then take 1 tab PO once daily   benzonatate (TESSALON) 100 MG capsule Take 1-2 caps PO TID PRN   predniSONE (DELTASONE) 20 MG tablet Take 3 tablets (60 mg total) by mouth daily with breakfast for 2 days, THEN 2 tablets (40 mg total) daily with breakfast for 2 days, THEN 1 tablet (20 mg total) daily with breakfast for 2 days, THEN 0.5 tablets (10 mg total) daily with breakfast for 2 days.    baclofen (LIORESAL) 10 MG tablet Take 0.5-1 tablets (5-10 mg total) by mouth 3 (three) times daily as needed for muscle spasms. (Patient not taking: Reported on 01/14/2023)   FLUoxetine (PROZAC) 20 MG capsule Take 1 capsule (20 mg total) by mouth daily. (Patient not taking: Reported on 01/14/2023)   lisinopril-hydrochlorothiazide (ZESTORETIC) 20-25 MG tablet Take 1 tablet by mouth daily. (Patient not taking: Reported on 01/14/2023)   tiZANidine (ZANAFLEX) 4 MG tablet Take 1-2 tablets (4-8 mg total) by mouth every 6 (six) hours as needed for muscle spasms. (Patient not taking: Reported on 01/14/2023)   [EXPIRED] acetaminophen (TYLENOL) tablet 975 mg    No facility-administered encounter medications on file as of 01/14/2023.    Past Medical History:  Diagnosis Date   Asthma    Back pain    Depression    Hypertension    Sciatica     History reviewed. No pertinent surgical history.  Family History  Problem Relation Age of Onset   Alcohol abuse Mother    Cirrhosis Mother    Cancer Mother        unknown type cancer   Hypertension Father     Social History   Socioeconomic History   Marital status: Significant Other  Spouse name: Not on file   Number of children: 12   Years of education: 12   Highest education level: High school graduate  Occupational History   Not on file  Tobacco Use   Smoking status: Every Day    Current packs/day: 0.50    Average packs/day: 0.5 packs/day for 29.0 years (14.5 ttl pk-yrs)    Types: Cigarettes   Smokeless tobacco: Never   Tobacco comments:    Information for Quitline given  Vaping Use   Vaping status: Never Used  Substance and Sexual Activity   Alcohol use: Yes    Comment: States drinks once weekly on weekend-beer   Drug use: No   Sexual activity: Not on file  Other Topics Concern   Not on file  Social History Narrative   Not on file   Social Determinants of Health   Financial Resource Strain: Low Risk  (12/13/2017)   Overall  Financial Resource Strain (CARDIA)    Difficulty of Paying Living Expenses: Not hard at all  Food Insecurity: Not on file  Transportation Needs: Not on file  Physical Activity: Not on file  Stress: Not on file  Social Connections: Not on file  Intimate Partner Violence: Not on file    Review of Systems  Constitutional:  Positive for chills and fever.  HENT:  Negative for sore throat.   Eyes: Negative.   Respiratory:  Positive for cough, sputum production and shortness of breath.   Cardiovascular:  Negative for chest pain.  Gastrointestinal:  Positive for diarrhea, nausea and vomiting.  Genitourinary: Negative.   Musculoskeletal:  Positive for myalgias.  Skin: Negative.   Neurological: Negative.   Endo/Heme/Allergies: Negative.   Psychiatric/Behavioral: Negative.          Objective    BP 113/88 (BP Location: Left Arm, Patient Position: Sitting, Cuff Size: Large)   Pulse (!) 128   Temp 100 F (37.8 C) (Oral)   Wt 150 lb 9.6 oz (68.3 kg)   BMI 22.90 kg/m   Physical Exam Vitals and nursing note reviewed.  Constitutional:      Appearance: Normal appearance.  HENT:     Head: Normocephalic and atraumatic.     Right Ear: External ear normal.     Left Ear: External ear normal.     Nose: Nose normal.     Mouth/Throat:     Mouth: Mucous membranes are dry.     Pharynx: Oropharynx is clear.  Cardiovascular:     Rate and Rhythm: Regular rhythm. Tachycardia present.  Pulmonary:     Effort: Pulmonary effort is normal.     Comments: Slight wheezing noted bilaterally  Musculoskeletal:        General: Normal range of motion.     Cervical back: Normal range of motion and neck supple.  Skin:    General: Skin is warm and dry.  Neurological:     General: No focal deficit present.     Mental Status: He is alert and oriented to person, place, and time.  Psychiatric:        Mood and Affect: Mood normal.        Behavior: Behavior normal.        Thought Content: Thought content  normal.        Judgment: Judgment normal.         Assessment & Plan:   Problem List Items Addressed This Visit   None Visit Diagnoses     Acute upper respiratory infection    -  Primary   Relevant Medications   predniSONE (DELTASONE) 20 MG tablet   azithromycin (ZITHROMAX) 250 MG tablet   benzonatate (TESSALON) 100 MG capsule   albuterol (VENTOLIN HFA) 108 (90 Base) MCG/ACT inhaler   acetaminophen (TYLENOL) tablet 975 mg (Completed)   Tobacco use disorder       Essential hypertension         1. Acute upper respiratory infection Rapid COVID-negative.  Reasonable to treat with azithromycin, prednisone, Tessalon Perles due to clinical presentation.  Patient to return to mobile unit in 2 weeks for follow-up - predniSONE (DELTASONE) 20 MG tablet; Take 3 tablets (60 mg total) by mouth daily with breakfast for 2 days, THEN 2 tablets (40 mg total) daily with breakfast for 2 days, THEN 1 tablet (20 mg total) daily with breakfast for 2 days, THEN 0.5 tablets (10 mg total) daily with breakfast for 2 days.  Dispense: 13 tablet; Refill: 0 - azithromycin (ZITHROMAX) 250 MG tablet; Take 2 tabs PO day 1, then take 1 tab PO once daily  Dispense: 6 tablet; Refill: 0 - benzonatate (TESSALON) 100 MG capsule; Take 1-2 caps PO TID PRN  Dispense: 20 capsule; Refill: 0 - albuterol (VENTOLIN HFA) 108 (90 Base) MCG/ACT inhaler; Inhale 2 puffs into the lungs every 6 (six) hours as needed for wheezing or shortness of breath.  Dispense: 8 g; Refill: 0 - acetaminophen (TYLENOL) tablet 975 mg  2. Essential hypertension Patient's blood pressure is within normal limits today, has been out of blood pressure medicine for the past month.  Patient to return to mobile unit in 2 weeks for further review.  3. Tobacco use disorder    I have reviewed the patient's medical history (PMH, PSH, Social History, Family History, Medications, and allergies) , and have been updated if relevant. I spent 30 minutes reviewing  chart and  face to face time with patient.     Return in about 2 weeks (around 01/28/2023) for With [facility].   Kasandra Knudsen Mayers, PA-C

## 2023-01-14 NOTE — Telephone Encounter (Signed)
Pt went to the [employer] [employer]

## 2023-01-15 ENCOUNTER — Encounter: Payer: Self-pay | Admitting: Physician Assistant

## 2023-01-15 LAB — POC COVID19 BINAXNOW: SARS Coronavirus 2 Ag: NEGATIVE

## 2023-01-15 NOTE — Addendum Note (Signed)
Addended by: Roney Jaffe on: 01/15/2023 11:10 AM   Modules accepted: Orders

## 2023-02-04 ENCOUNTER — Telehealth: Payer: Self-pay

## 2023-02-04 NOTE — Telephone Encounter (Signed)
Contacted to inform of [facility] locations for the week, to complete follow up visit. No answer/LVM

## 2023-02-12 ENCOUNTER — Encounter (INDEPENDENT_AMBULATORY_CARE_PROVIDER_SITE_OTHER): Payer: Self-pay | Admitting: Primary Care

## 2023-02-12 ENCOUNTER — Ambulatory Visit (INDEPENDENT_AMBULATORY_CARE_PROVIDER_SITE_OTHER): Payer: 59 | Admitting: Primary Care

## 2023-02-12 VITALS — BP 121/87 | HR 97 | Resp 16 | Ht 69.0 in | Wt 154.6 lb

## 2023-02-12 DIAGNOSIS — G8929 Other chronic pain: Secondary | ICD-10-CM

## 2023-02-12 DIAGNOSIS — M5489 Other dorsalgia: Secondary | ICD-10-CM | POA: Diagnosis not present

## 2023-02-12 DIAGNOSIS — R351 Nocturia: Secondary | ICD-10-CM | POA: Diagnosis not present

## 2023-02-12 DIAGNOSIS — R062 Wheezing: Secondary | ICD-10-CM | POA: Diagnosis not present

## 2023-02-12 DIAGNOSIS — Z1211 Encounter for screening for malignant neoplasm of colon: Secondary | ICD-10-CM

## 2023-02-12 DIAGNOSIS — M542 Cervicalgia: Secondary | ICD-10-CM | POA: Diagnosis not present

## 2023-02-12 DIAGNOSIS — H5789 Other specified disorders of eye and adnexa: Secondary | ICD-10-CM | POA: Diagnosis not present

## 2023-02-12 DIAGNOSIS — Z2821 Immunization not carried out because of patient refusal: Secondary | ICD-10-CM | POA: Diagnosis not present

## 2023-02-12 DIAGNOSIS — Z1159 Encounter for screening for other viral diseases: Secondary | ICD-10-CM | POA: Diagnosis not present

## 2023-02-12 DIAGNOSIS — Z7689 Persons encountering health services in other specified circumstances: Secondary | ICD-10-CM | POA: Diagnosis not present

## 2023-02-12 MED ORDER — ALBUTEROL SULFATE HFA 108 (90 BASE) MCG/ACT IN AERS: 2.0000 | INHALATION_SPRAY | Freq: Four times a day (QID) | RESPIRATORY_TRACT | 2 refills | Status: DC | PRN

## 2023-02-12 NOTE — Patient Instructions (Signed)
 Lipoma  A lipoma is a noncancerous (benign) tumor that is made up of fat cells. This is a very common type of soft-tissue growth. Lipomas are usually found under the skin (subcutaneous). They may occur in any tissue of the body that contains fat. Common areas for lipomas to appear include the back, arms, shoulders, buttocks, and thighs. Lipomas grow slowly, and they are usually painless. Most lipomas do not cause problems and do not require treatment. What are the causes? The cause of this condition is not known. What increases the risk? You are more likely to develop this condition if: You are 18-42 years old. You have a family history of lipomas. What are the signs or symptoms? A lipoma usually appears as a small, round bump under the skin. In most cases, the lump will: Feel soft or rubbery. Not cause pain or other symptoms. However, if a lipoma is located in an area where it pushes on nerves, it can become painful or cause other symptoms. How is this diagnosed? A lipoma can usually be diagnosed with a physical exam. You may also have tests to confirm the diagnosis and to rule out other conditions. Tests may include: Imaging tests, such as a CT scan or an MRI. Removal of a tissue sample to be looked at under a microscope (biopsy). How is this treated? Treatment for this condition depends on the size of the lipoma and whether it is causing any symptoms. For small lipomas that are not causing problems, no treatment is needed. If a lipoma is bigger or it causes problems, surgery may be done to remove the lipoma. Lipomas can also be removed to improve appearance. Most often, the procedure is done after applying a medicine that numbs the area (local anesthetic). Liposuction may be done to reduce the size of the lipoma before it is removed through surgery, or it may be done to remove the lipoma. Lipomas are removed with this method to limit incision size and scarring. A liposuction tube is  inserted through a small incision into the lipoma, and the contents of the lipoma are removed through the tube with suction. Follow these instructions at home: Watch your lipoma for any changes. Keep all follow-up visits. This is important. Where to find more information OrthoInfo: orthoinfo.aaos.org Contact a health care provider if: Your lipoma becomes larger or hard. Your lipoma becomes painful, red, or increasingly swollen. These could be signs of infection or a more serious condition. Get help right away if: You develop tingling or numbness in an area near the lipoma. This could indicate that the lipoma is causing nerve damage. Summary A lipoma is a noncancerous tumor that is made up of fat cells. Most lipomas do not cause problems and do not require treatment. If a lipoma is bigger or it causes problems, surgery may be done to remove the lipoma. Contact a health care provider if your lipoma becomes larger or hard, or if it becomes painful, red, or increasingly swollen. These could be signs of infection or a more serious condition. This information is not intended to replace advice given to you by your health care provider. Make sure you discuss any questions you have with your health care provider. Document Revised: 02/10/2021 Document Reviewed: 02/10/2021 Elsevier Patient Education  2024 [employer].

## 2023-02-12 NOTE — Progress Notes (Signed)
 Establish   Subjective    Patient ID: Patrick Solomon male  DOB: 1970/08/28  Age: 53 y.o. MRN: [id number]   CC:   HPI     Back Pain    Additional comments: Pain in the lumbar         Cyst    Additional comments: Left eye        Neck Pain    Additional comments: Not a lot of range of motion       Last edited by Casimir Juvenal SAUNDERS, RMA on 02/12/2023  3:29 PM.      Back Pain  Neck Pain     Patrick Solomon is a 53 year old male who presents after not being seen for 2 years for complaints of cervical and thoracic, lumbar pain and the most worrisome is a cyst under his left eye.  Stated it smells unable to detect any malodorous smells he is also tried to remove it but it remains. No current outpatient medications on file prior to visit.   No current facility-administered medications on file prior to visit.     Allergies  Allergen Reactions   Food     Pickles/Peanuts/Tuna Fish---Rash/Spots on body    Past Medical History:  Diagnosis Date   Asthma    Back pain    Depression    Hypertension    Sciatica      No past surgical history on file.   Family History  Problem Relation Age of Onset   Alcohol abuse Mother    Cirrhosis Mother    Cancer Mother        unknown type cancer   Hypertension Father     Social History   Socioeconomic History   Marital status: Significant Other    Spouse name: Not on file   Number of children: 12   Years of education: 12   Highest education level: High school graduate  Occupational History   Not on file  Tobacco Use   Smoking status: Every Day    Current packs/day: 0.50    Average packs/day: 0.5 packs/day for 29.0 years (14.5 ttl pk-yrs)    Types: Cigarettes   Smokeless tobacco: Never   Tobacco comments:    Information for Quitline given  Vaping Use   Vaping status: Never Used  Substance and Sexual Activity   Alcohol use: Yes    Comment: States drinks once weekly on weekend-beer   Drug use: No   Sexual  activity: Not on file  Other Topics Concern   Not on file  Social History Narrative   Not on file   Social Drivers of Health   Financial Resource Strain: Low Risk  (12/13/2017)   Overall Financial Resource Strain (CARDIA)    Difficulty of Paying Living Expenses: Not hard at all  Food Insecurity: No Food Insecurity (02/12/2023)   Hunger Vital Sign    Worried About Running Out of Food in the Last Year: Never true    Ran Out of Food in the Last Year: Never true  Transportation Needs: No Transportation Needs (02/12/2023)   PRAPARE - Administrator, Civil Service (Medical): No    Lack of Transportation (Non-Medical): No  Physical Activity: Not on file  Stress: Not on file  Social Connections: Not on file  Intimate Partner Violence: Not At Risk (02/12/2023)   Humiliation, Afraid, Rape, and Kick questionnaire    Fear of Current or Ex-Partner: No    Emotionally Abused: No    Physically Abused:  No    Sexually Abused: No    SDOH Interventions Today    Flowsheet Row Most Recent Value  SDOH Interventions   Food Insecurity Interventions Intervention Not Indicated  Housing Interventions Intervention Not Indicated  Transportation Interventions Intervention Not Indicated        Health Maintenance  Topic Date Due   Hepatitis C Screening  Never done   Colon Cancer Screening  Never done   Flu Shot  05/06/2023*   Zoster (Shingles) Vaccine (1 of 2) 05/13/2023*   DTaP/Tdap/Td vaccine (2 - Td or Tdap) 04/12/2029   COVID-19 Vaccine  Completed   HIV Screening  Completed   HPV Vaccine  Aged Out  *Topic was postponed. The date shown is not the original due date.    Objective    BP 121/87   Pulse 97   Resp 16   Ht 5' 9 (1.753 m)   Wt 154 lb 9.6 oz (70.1 kg)   SpO2 96%   BMI 22.83 kg/m   Physical exam: General: Vital signs reviewed.  Patient is well-developed and well-nourished, xx in no acute distress and cooperative with exam. Head: Normocephalic and atraumatic. Eyes:  EOMI, conjunctivae normal, no scleral icterus. Neck:  stiff ROM, no JVD, masses, thyromegaly, or carotid bruit present. Cardiovascular: RRR, S1 normal, S2 normal, no murmurs, gallops, or rubs. Pulmonary/Chest: Clear to auscultation bilaterally, no wheezes, rales, or rhonchi. Abdominal: Soft, non-tender, non-distended, BS +, no masses, organomegaly, or guarding present. Musculoskeletal: No joint deformities, erythema, or stiffness, ROM full and nontender. Extremities: No lower extremity edema bilaterally,  pulses symmetric and intact bilaterally. No cyanosis or clubbing. Neurological: A&O x3, Strength is normal Skin: Warm, dry and intact. No rashes or erythema. Psychiatric: Normal mood and affect. speech and behavior is normal. Cognition and memory are normal.      Assessment & Plan:  Patrick Solomon was seen today for new patient (initial visit), back pain, cyst and neck pain.  Diagnoses and all orders for this visit:  Influenza vaccination declined  Herpes zoster vaccination declined  Cyst of eye  -     Ambulatory referral to Dermatology  Cervical pain (neck) -     Ambulatory referral to Orthopedic Surgery  Other chronic back pain -     Ambulatory referral to Orthopedic Surgery  Encounter for hepatitis C screening test for low risk patient -     HCV Ab w Reflex to Quant PCR  Nocturia -     PSA  Wheezing -     albuterol  (VENTOLIN  HFA) 108 (90 Base) MCG/ACT inhaler; Inhale 2 puffs into the lungs every 6 (six) hours as needed for wheezing or shortness of breath.  Encounter to establish care (RE) -     CBC with Differential/Platelet -     CMP14+EGFR      Ambulatory referral to Gastroenterology        Internal Referral, Routine, [facility], Gastroenterology, Specialty Services Required What is the reason for referral? Colonoscopy              Follow-up:  Return for annual physical.  The above assessment and management plan was discussed with the patient. The  patient verbalized understanding of and has agreed to the management plan. Patient is aware to call the clinic if symptoms fail to improve or worsen. Patient is aware when to return to the clinic for a follow-up visit. Patient educated on when it is appropriate to go to the emergency department.   Rosaline Bohr, NP-C

## 2023-02-13 LAB — CMP14+EGFR
ALT: 11 [IU]/L (ref 0–44)
AST: 19 [IU]/L (ref 0–40)
Albumin: 4.3 g/dL (ref 3.8–4.9)
Alkaline Phosphatase: 64 [IU]/L (ref 44–121)
BUN/Creatinine Ratio: 10 (ref 9–20)
BUN: 10 mg/dL (ref 6–24)
Bilirubin Total: 0.4 mg/dL (ref 0.0–1.2)
CO2: 27 mmol/L (ref 20–29)
Calcium: 9.2 mg/dL (ref 8.7–10.2)
Chloride: 102 mmol/L (ref 96–106)
Creatinine, Ser: 0.99 mg/dL (ref 0.76–1.27)
Globulin, Total: 2.6 g/dL (ref 1.5–4.5)
Glucose: 45 mg/dL — ABNORMAL LOW (ref 70–99)
Potassium: 4.3 mmol/L (ref 3.5–5.2)
Sodium: 140 mmol/L (ref 134–144)
Total Protein: 6.9 g/dL (ref 6.0–8.5)
eGFR: 91 mL/min/{1.73_m2} (ref >59)

## 2023-02-13 LAB — HCV INTERPRETATION

## 2023-02-13 LAB — CBC WITH DIFFERENTIAL/PLATELET
Basophils Absolute: 0 10*3/uL (ref 0.0–0.2)
Basos: 0 %
EOS (ABSOLUTE): 0 10*3/uL (ref 0.0–0.4)
Eos: 0 %
Hematocrit: 47.4 % (ref 37.5–51.0)
Hemoglobin: 15.2 g/dL (ref 13.0–17.7)
Immature Grans (Abs): 0 10*3/uL (ref 0.0–0.1)
Immature Granulocytes: 0 %
Lymphocytes Absolute: 2 10*3/uL (ref 0.7–3.1)
Lymphs: 32 %
MCH: 28.3 pg (ref 26.6–33.0)
MCHC: 32.1 g/dL (ref 31.5–35.7)
MCV: 88 fL (ref 79–97)
Monocytes Absolute: 0.9 10*3/uL (ref 0.1–0.9)
Monocytes: 15 %
Neutrophils Absolute: 3.3 10*3/uL (ref 1.4–7.0)
Neutrophils: 53 %
Platelets: 244 10*3/uL (ref 150–450)
RBC: 5.37 x10E6/uL (ref 4.14–5.80)
RDW: 12.9 % (ref 11.6–15.4)
WBC: 6.2 10*3/uL (ref 3.4–10.8)

## 2023-02-13 LAB — HCV AB W REFLEX TO QUANT PCR: HCV Ab: NONREACTIVE

## 2023-02-13 LAB — PSA: Prostate Specific Ag, Serum: 1.2 ng/mL (ref 0.0–4.0)

## 2023-02-19 ENCOUNTER — Ambulatory Visit: Payer: 59 | Admitting: Physical Medicine and Rehabilitation

## 2023-02-25 ENCOUNTER — Encounter (INDEPENDENT_AMBULATORY_CARE_PROVIDER_SITE_OTHER): Payer: Self-pay

## 2023-02-27 ENCOUNTER — Encounter: Payer: Self-pay | Admitting: Physical Medicine and Rehabilitation

## 2023-02-27 ENCOUNTER — Ambulatory Visit (INDEPENDENT_AMBULATORY_CARE_PROVIDER_SITE_OTHER): Payer: 59 | Admitting: Physical Medicine and Rehabilitation

## 2023-02-27 DIAGNOSIS — G8929 Other chronic pain: Secondary | ICD-10-CM

## 2023-02-27 DIAGNOSIS — M542 Cervicalgia: Secondary | ICD-10-CM

## 2023-02-27 DIAGNOSIS — M7918 Myalgia, other site: Secondary | ICD-10-CM | POA: Diagnosis not present

## 2023-02-27 DIAGNOSIS — M5442 Lumbago with sciatica, left side: Secondary | ICD-10-CM | POA: Diagnosis not present

## 2023-02-27 DIAGNOSIS — M5416 Radiculopathy, lumbar region: Secondary | ICD-10-CM | POA: Diagnosis not present

## 2023-02-27 DIAGNOSIS — M5441 Lumbago with sciatica, right side: Secondary | ICD-10-CM

## 2023-02-27 MED ORDER — METHOCARBAMOL 500 MG PO TABS: 500.0000 mg | ORAL_TABLET | Freq: Three times a day (TID) | ORAL | 0 refills | Status: DC

## 2023-02-27 MED ORDER — MELOXICAM 15 MG PO TABS: 15.0000 mg | ORAL_TABLET | Freq: Every day | ORAL | 0 refills | Status: DC

## 2023-02-27 NOTE — Progress Notes (Signed)
Had a injury dead lifting maybe 10 ish years ago. He felt like something exploded in his back.  He was in prison at the time and they did xray's there no fractures. He stated they gave him some meds and ice and sent him on his way. He does have pain that travels down his legs it started on his left side now it is on his R. Sitting for long periods of time can cause pain.  When he turns his neck he has limited motion.  Decreased rom in both arms  L side is wore than R in his neck.

## 2023-02-27 NOTE — Progress Notes (Signed)
Patrick Solomon - 53 y.o. male MRN [id number]  Date of birth: [birth year]  Office Visit Note: Visit Date: 02/27/2023 PCP: Patrick Sessions, NP Referred by: Patrick Sessions, NP  Subjective: Chief Complaint  Patient presents with   Neck - Pain   Lower Back - Pain   Middle Back - Pain   HPI: Patrick Solomon is a 53 y.o. male who comes in today per the request of Patrick Passe, NP for evaluation of chronic, worsening and severe bilateral lower back pain radiating to buttocks and down posterior legs to heels, left greater than right.  He was recently incarcareted for several years, released in October of 2024. Pain ongoing for 10 years, started after he felt severe pain in his back while dead lifting. His pain worsens with prolonged sitting. States he is unable to bend down and lift heavy objects due to severe pain. He describes his pain as shocking, stabbing and sore sensation, currently rates as 7 out of 10. Some relief of pain with home exercise regimen, rest and use of medications. No relief of pain with muscle relaxer and anti inflammatory medications. No history of lumbar surgery/injections.   Chronic, worsening and severe bilateral neck pain radiating to upper back. No radicular pain down the arms. Pain ongoing for 5 plus years. His pain becomes worse with activity. He describes pain sore and aching sensation, currently rates as 7 out of 10. Some relief of pain with home exercise regimen, rest and use of medications. No history of formal physical therapy. His pain becomes severe with flexion and extension of neck. He reports difficulty sleeping due to severe pain. Patient denies focal weakness, numbness and tingling. No recent trauma or falls.         Review of Systems  Musculoskeletal:  Positive for back pain, myalgias and neck pain.  Neurological:  Negative for tingling, sensory change, focal weakness and weakness.  All other systems reviewed and are negative.  Otherwise  per HPI.  Assessment & Plan: Visit Diagnoses:    ICD-10-CM   1. Chronic bilateral low back pain with bilateral sciatica  M54.42    M54.41    G89.29     2. Lumbar radiculopathy  M54.16 MR LUMBAR SPINE WO CONTRAST    Ambulatory referral to Physical Therapy    3. Cervicalgia  M54.2 Ambulatory referral to Physical Therapy    4. Myofascial pain syndrome  M79.18        Plan: Findings:  1. Chronic, worsening and severe bilateral lower back pain radiating down posterior legs to heels, left greater than right. Patient continues to have severe pain despite good conservative therapies such as home exercise regimen, rest and use of medications. Patients clinical presentation and exam are consistent with S1 nerve pattern. We discussed treatment plan in detail today, next step is to place order for lumbar MRI imaging. Depending on results of MRI imaging we discussed possibility of performing lumbar epidural steroid injection. I also placed order for physical therapy to address lower back issues.   2. Chronic, worsening and severe bilateral neck pain radiating to upper back. Patient continues to have severe pain despite good conservative therapies such as home exercise regimen, rest and use of medications. Patients clinical presentation and exam are consistent with myofascial pain syndrome. Myofascial tenderness noted to bilateral levator scapulae and trapezius regions upon palpation today. Next step is to place order for short course of formal physical therapy with a focus on manual treatments and dry needling. We also  discussed medication management today and I prescribed meloxicam and robaxin for him to try. No red flag symptoms noted upon exam today.     Meds & Orders:  Meds ordered this encounter  Medications   meloxicam (MOBIC) 15 MG tablet    Sig: Take 1 tablet (15 mg total) by mouth daily.    Dispense:  30 tablet    Refill:  0   methocarbamol (ROBAXIN) 500 MG tablet    Sig: Take 1 tablet (500  mg total) by mouth 3 (three) times daily.    Dispense:  90 tablet    Refill:  0    Orders Placed This Encounter  Procedures   MR LUMBAR SPINE WO CONTRAST   Ambulatory referral to Physical Therapy    Follow-up: Return for Lumbar MRI review.   Procedures: No procedures performed      Clinical History: No specialty comments available.   He reports that he has been smoking cigarettes. He has a 14.5 pack-year smoking history. He has never used smokeless tobacco. No results for input(s): "HGBA1C", "LABURIC" in the last 8760 hours.  Objective:  VS:  HT:    WT:   BMI:     BP:   HR: bpm  TEMP: ( )  RESP:  Physical Exam Vitals and nursing note reviewed.  HENT:     Head: Normocephalic and atraumatic.     Right Ear: External ear normal.     Left Ear: External ear normal.     Nose: Nose normal.     Mouth/Throat:     Mouth: Mucous membranes are moist.  Eyes:     Extraocular Movements: Extraocular movements intact.  Cardiovascular:     Rate and Rhythm: Normal rate.     Pulses: Normal pulses.  Pulmonary:     Effort: Pulmonary effort is normal.  Abdominal:     General: Abdomen is flat. There is no distension.  Musculoskeletal:        General: Tenderness present.     Cervical back: Tenderness present.     Comments: Patient rises from seated position to standing without difficulty. Good lumbar range of motion. No pain noted with facet loading. 5/5 strength noted with bilateral hip flexion, knee flexion/extension, ankle dorsiflexion/plantarflexion and EHL. No clonus noted bilaterally. No pain upon palpation of greater trochanters. No pain with internal/external rotation of bilateral hips. Sensation intact bilaterally. Dysesthesias noted to bilateral S1 dermatomes. Negative slump test bilaterally. Ambulates without aid, gait steady.   Discomfort noted with flexion and extension. Patient has good strength in the upper extremities including 5 out of 5 strength in wrist extension, long  finger flexion and APB. Shoulder range of motion is full bilaterally without any sign of impingement. There is no atrophy of the hands intrinsically. Sensation intact bilaterally. Myofascial tenderness noted to bilateral levator scapulae and trapezius regions. Negative Hoffman's sign. Negative Spurling's sign.       Skin:    General: Skin is warm and dry.     Capillary Refill: Capillary refill takes less than 2 seconds.  Neurological:     General: No focal deficit present.     Mental Status: He is alert and oriented to person, place, and time.  Psychiatric:        Mood and Affect: Mood normal.        Behavior: Behavior normal.     Ortho Exam  Imaging: No results found.  Past Medical/Family/Surgical/Social History: Medications & Allergies reviewed per EMR, new medications updated. Patient Active Problem  List   Diagnosis Date Noted   Cocaine abuse (HCC) 06/17/2019   Substance induced mood disorder (HCC) 06/17/2019   Past Medical History:  Diagnosis Date   Asthma    Back pain    Depression    Hypertension    Sciatica    Family History  Problem Relation Age of Onset   Alcohol abuse Mother    Cirrhosis Mother    Cancer Mother        unknown type cancer   Hypertension Father    History reviewed. No pertinent surgical history. Social History   Occupational History   Not on file  Tobacco Use   Smoking status: Every Day    Current packs/day: 0.50    Average packs/day: 0.5 packs/day for 29.0 years (14.5 ttl pk-yrs)    Types: Cigarettes   Smokeless tobacco: Never   Tobacco comments:    Information for Quitline given  Vaping Use   Vaping status: Never Used  Substance and Sexual Activity   Alcohol use: Yes    Comment: States drinks once weekly on weekend-beer   Drug use: No   Sexual activity: Not on file

## 2023-03-05 ENCOUNTER — Encounter: Payer: Self-pay | Admitting: Physical Medicine and Rehabilitation

## 2023-03-08 DIAGNOSIS — R059 Cough, unspecified: Secondary | ICD-10-CM | POA: Diagnosis not present

## 2023-03-08 DIAGNOSIS — Z20828 Contact with and (suspected) exposure to other viral communicable diseases: Secondary | ICD-10-CM | POA: Diagnosis not present

## 2023-03-08 DIAGNOSIS — R509 Fever, unspecified: Secondary | ICD-10-CM | POA: Diagnosis not present

## 2023-03-11 ENCOUNTER — Ambulatory Visit
Admission: RE | Admit: 2023-03-11 | Discharge: 2023-03-11 | Disposition: A | Discharge status: Home / Self Care | Payer: 59 | Source: Ambulatory Visit | Attending: Physical Medicine and Rehabilitation | Admitting: Physical Medicine and Rehabilitation

## 2023-03-11 DIAGNOSIS — M545 Low back pain, unspecified: Secondary | ICD-10-CM | POA: Diagnosis not present

## 2023-03-11 DIAGNOSIS — M5416 Radiculopathy, lumbar region: Secondary | ICD-10-CM

## 2023-03-11 NOTE — Therapy (Incomplete)
OUTPATIENT PHYSICAL THERAPY EVALUATION   Patient Name: Patrick Solomon MRN: [id number] DOB:10/12/1970, 53 y.o., male Today's Date: 03/11/2023  END OF SESSION:   Past Medical History:  Diagnosis Date   Asthma    Back pain    Depression    Hypertension    Sciatica    No past surgical history on file. Patient Active Problem List   Diagnosis Date Noted   Cocaine abuse (HCC) 06/17/2019   Substance induced mood disorder (HCC) 06/17/2019    PCP: Grayce Sessions NP  REFERRING PROVIDER: Juanda Chance, NP  REFERRING DIAG: M54.16 (ICD-10-CM) - Lumbar radiculopathy M54.2 (ICD-10-CM) - Cervicalgia  Rationale for Evaluation and Treatment: Rehabilitation  THERAPY DIAG:  No diagnosis found.  ONSET DATE: ***  SUBJECTIVE:                                                                                                                                                                                           SUBJECTIVE STATEMENT: ***  PERTINENT HISTORY:  ***  PAIN:  NPRS scale: ***/10 Pain location: cervical:   lumbar:   Pain description: *** Aggravating factors: *** Relieving factors: ***  PRECAUTIONS: None  WEIGHT BEARING RESTRICTIONS: No  FALLS:  Has patient fallen in last 6 months? No  LIVING ENVIRONMENT: Lives with: {[facility] lives with:25569::"lives with their family"} Lives in: {Lives in:[address]70} Stairs: {opstairs:27293} Has following equipment at home: {Assistive devices:23999}  OCCUPATION: ***  PLOF: Independent  PATIENT GOALS: ***  Next MD Visit:    OBJECTIVE:   PATIENT SURVEYS:  03/12/2023 FOTO eval:    predicted:    SCREENING FOR RED FLAGS: 03/12/2023 Bowel or bladder incontinence: No Cauda equina syndrome: No  COGNITION: 03/12/2023 Overall cognitive status: WFL normal      SENSATION: 03/11/2023 {sensation:27233}  MUSCLE LENGTH: 03/12/2023 Hamstrings: Right *** deg; Left *** deg Maisie Fus test: Right *** deg; Left *** deg  POSTURE:   03/12/2023 {posture:25561}  PALPATION: 03/12/2023 ***  CERVICAL ROM:   Active ROM A/PROM (deg) 03/12/2023  Flexion   Extension   Right lateral flexion   Left lateral flexion   Right rotation   Left rotation    (Blank rows = not tested)   LUMBAR ROM:  03/12/2023 Directional Preference Assessment: Centralization: Peripheralization:   AROM 03/12/2023  Flexion   Extension   Right lateral flexion   Left lateral flexion   Right rotation   Left rotation    (Blank rows = not tested)  LOWER EXTREMITY ROM:       Right 03/12/2023 Left 03/12/2023  Hip flexion    Hip extension    Hip abduction    Hip  adduction    Hip internal rotation    Hip external rotation    Knee flexion    Knee extension    Ankle dorsiflexion    Ankle plantarflexion    Ankle inversion    Ankle eversion     (Blank rows = not tested)  LOWER EXTREMITY MMT:    MMT Right 03/12/2023 Left 03/12/2023  Hip flexion    Hip extension    Hip abduction    Hip adduction    Hip internal rotation    Hip external rotation    Knee flexion    Knee extension    Ankle dorsiflexion    Ankle plantarflexion    Ankle inversion    Ankle eversion     (Blank rows = not tested)  LUMBAR SPECIAL TESTS:  03/12/2023 {lumbar special test:25242}  FUNCTIONAL TESTS:  03/12/2023 {Functional tests:24029}  GAIT: 03/12/2023                                                                                                                                                                                                                   TODAY'S TREATMENT:                                                                                                         DATE: 03/12/2023  Therex:    HEP instruction/performance c cues for techniques, handout provided.  Trial set performed of each for comprehension and symptom assessment.  See below for exercise list  PATIENT EDUCATION:  03/12/2023 Education details: HEP, POC Person educated:  Patient Education method: Explanation, Demonstration, Verbal cues, and Handouts Education comprehension: verbalized understanding, returned demonstration, and verbal cues required  HOME EXERCISE PROGRAM: ***  ASSESSMENT:  CLINICAL IMPRESSION: Patient is a 53 y.o. who comes to clinic with complaints of ***pain with mobility, strength and movement coordination deficits that impair their ability to perform usual daily and recreational functional activities without increase difficulty/symptoms at this time.  Patient to benefit from skilled PT services to address impairments and limitations to improve to previous level of function without  restriction secondary to condition.   OBJECTIVE IMPAIRMENTS: {opptimpairments:25111}.   ACTIVITY LIMITATIONS: {activitylimitations:27494}  PARTICIPATION LIMITATIONS: {participationrestrictions:25113}  PERSONAL FACTORS: {Personal factors:25162} are also affecting patient's functional outcome.   REHAB POTENTIAL: {rehabpotential:25112}  CLINICAL DECISION MAKING: {clinical decision making:25114}  EVALUATION COMPLEXITY: {Evaluation complexity:25115}   GOALS: Goals reviewed with patient? Yes  SHORT TERM GOALS: (target date for Short term goals are 3 weeks ***)  1. Patient will demonstrate independent use of home exercise program to maintain progress from in clinic treatments.  Goal status: New  LONG TERM GOALS: (target dates for all long term goals are 10 weeks  *** )   1. Patient will demonstrate/report pain at worst less than or equal to 2/10 to facilitate minimal limitation in daily activity secondary to pain symptoms.  Goal status: New   2. Patient will demonstrate independent use of home exercise program to facilitate ability to maintain/progress functional gains from skilled physical therapy services.  Goal status: New   3. Patient will demonstrate FOTO outcome > or = *** % to indicate reduced disability due to condition.  Goal status:  New   4. Patient will demonstrate lumbar extension 100 % WFL s symptoms to facilitate upright standing, walking posture at PLOF s limitation.  Goal status: New   5.  ***  Goal status: New   6.  *** Goal status: New   7.  *** Goal Status: New  PLAN:  PT FREQUENCY: 1-2x/week  PT DURATION: 10 weeks  PLANNED INTERVENTIONS: Can include 65784- PT Re-evaluation, 97110-Therapeutic exercises, 97530- Therapeutic activity, [id number]- Neuromuscular re-education, 97535- Self Care, 97140- Manual therapy, [id number]- Gait training, [phone]- Orthotic Fit/training, [id number]- Canalith repositioning, [id number]- Aquatic Therapy, 97014- Electrical stimulation (unattended), 97750 Physical performance testing, [id number]- Electrical stimulation (manual), 97016- Vasopneumatic device, [id number]- Ultrasound, [id number]- Traction (mechanical), [id number]- Ionotophoresis 4mg /ml Dexamethasone, Patient/Family education, Balance training, Stair training, Taping, Dry Needling, Joint mobilization, Joint manipulation, Spinal manipulation, Spinal mobilization, Scar mobilization, Vestibular training, Visual/preceptual remediation/compensation, DME instructions, Cryotherapy, and Moist heat.  All performed as medically necessary.  All included unless contraindicated  PLAN FOR NEXT SESSION: Review HEP knowledge/results.    Chyrel Masson, PT, DPT, OCS, ATC 03/11/23  4:15 PM

## 2023-03-12 ENCOUNTER — Ambulatory Visit: Payer: 59 | Admitting: Rehabilitative and Restorative Service Providers"

## 2023-03-20 ENCOUNTER — Telehealth: Payer: Self-pay

## 2023-03-20 NOTE — Telephone Encounter (Signed)
Copied from CRM [id number]. Topic: Clinical - Lab/Test Results >> Mar 18, 2023  1:40 PM Carlatta H wrote: Reason for CRM: Please call patient back with MRI results once received

## 2023-03-20 NOTE — Telephone Encounter (Signed)
noted

## 2023-03-28 ENCOUNTER — Other Ambulatory Visit: Payer: Self-pay | Admitting: Physical Medicine and Rehabilitation

## 2023-04-01 ENCOUNTER — Telehealth: Payer: Self-pay

## 2023-04-01 NOTE — Telephone Encounter (Signed)
 Copied from CRM [id number]. Topic: Clinical - Lab/Test Results >> Apr 01, 2023 12:00 PM Patrick Solomon C wrote: Reason for CRM: Patient would like a call back to discuss his MRI results. Call back # is [phone]

## 2023-04-01 NOTE — Telephone Encounter (Signed)
 We didn't order a MRI on patient. Please have pt reach out to the provider who did

## 2023-04-01 NOTE — Telephone Encounter (Signed)
 Spoke with patient advised that he should [facility] contact information provided.

## 2023-04-01 NOTE — Telephone Encounter (Signed)
 Routing to office

## 2023-04-04 ENCOUNTER — Ambulatory Visit: Payer: 59 | Admitting: Rehabilitative and Restorative Service Providers"

## 2023-04-04 NOTE — Therapy (Incomplete)
OUTPATIENT PHYSICAL THERAPY EVALUATION   Patient Name: Patrick Solomon MRN: [id number] DOB:April 06, 1970, 53 y.o., male Today's Date: 04/04/2023  END OF SESSION:   Past Medical History:  Diagnosis Date   Asthma    Back pain    Depression    Hypertension    Sciatica    No past surgical history on file. Patient Active Problem List   Diagnosis Date Noted   Cocaine abuse (HCC) 06/17/2019   Substance induced mood disorder (HCC) 06/17/2019    PCP: Grayce Sessions NP  REFERRING PROVIDER: Juanda Chance, NP  REFERRING DIAG: M54.16 (ICD-10-CM) - Lumbar radiculopathy M54.2 (ICD-10-CM) - Cervicalgia  Rationale for Evaluation and Treatment: Rehabilitation  THERAPY DIAG:  No diagnosis found.  ONSET DATE: ***  SUBJECTIVE:                                                                                                                                                                                           SUBJECTIVE STATEMENT: ***  PERTINENT HISTORY:  ***  PAIN:  NPRS scale: ***/10 Pain location: cervical:   lumbar:   Pain description: *** Aggravating factors: *** Relieving factors: ***  PRECAUTIONS: None  WEIGHT BEARING RESTRICTIONS: No  FALLS:  Has patient fallen in last 6 months? No  LIVING ENVIRONMENT: Lives with: {[facility] lives with:25569::"lives with their family"} Lives in: {Lives in:[address]} Stairs: {opstairs:27293} Has following equipment at home: {Assistive devices:23999}  OCCUPATION: ***  PLOF: Independent  PATIENT GOALS: ***  Next MD Visit:    OBJECTIVE:   PATIENT SURVEYS:  03/12/2023 FOTO eval:    predicted:    SCREENING FOR RED FLAGS: 03/12/2023 Bowel or bladder incontinence: No Cauda equina syndrome: No  COGNITION: 03/12/2023 Overall cognitive status: WFL normal      SENSATION: 03/11/2023 {sensation:27233}  MUSCLE LENGTH: 03/12/2023 Hamstrings: Right *** deg; Left *** deg Maisie Fus test: Right *** deg; Left *** deg  POSTURE:   03/12/2023 {posture:25561}  PALPATION: 03/12/2023 ***  CERVICAL ROM:   Active ROM A/PROM (deg) 03/12/2023  Flexion   Extension   Right lateral flexion   Left lateral flexion   Right rotation   Left rotation    (Blank rows = not tested)   LUMBAR ROM:  03/12/2023 Directional Preference Assessment: Centralization: Peripheralization:   AROM 03/12/2023  Flexion   Extension   Right lateral flexion   Left lateral flexion   Right rotation   Left rotation    (Blank rows = not tested)  LOWER EXTREMITY ROM:       Right 03/12/2023 Left 03/12/2023  Hip flexion    Hip extension    Hip abduction    Hip  adduction    Hip internal rotation    Hip external rotation    Knee flexion    Knee extension    Ankle dorsiflexion    Ankle plantarflexion    Ankle inversion    Ankle eversion     (Blank rows = not tested)  LOWER EXTREMITY MMT:    MMT Right 03/12/2023 Left 03/12/2023  Hip flexion    Hip extension    Hip abduction    Hip adduction    Hip internal rotation    Hip external rotation    Knee flexion    Knee extension    Ankle dorsiflexion    Ankle plantarflexion    Ankle inversion    Ankle eversion     (Blank rows = not tested)  LUMBAR SPECIAL TESTS:  03/12/2023 {lumbar special test:25242}  FUNCTIONAL TESTS:  03/12/2023 {Functional tests:24029}  GAIT: 03/12/2023                                                                                                                                                                                                                   TODAY'S TREATMENT:                                                                                                         DATE: 03/12/2023  Therex:    HEP instruction/performance c cues for techniques, handout provided.  Trial set performed of each for comprehension and symptom assessment.  See below for exercise list  PATIENT EDUCATION:  03/12/2023 Education details: HEP, POC Person educated:  Patient Education method: Explanation, Demonstration, Verbal cues, and Handouts Education comprehension: verbalized understanding, returned demonstration, and verbal cues required  HOME EXERCISE PROGRAM: ***  ASSESSMENT:  CLINICAL IMPRESSION: Patient is a 53 y.o. who comes to clinic with complaints of ***pain with mobility, strength and movement coordination deficits that impair their ability to perform usual daily and recreational functional activities without increase difficulty/symptoms at this time.  Patient to benefit from skilled PT services to address impairments and limitations to improve to previous level of function without  restriction secondary to condition.   OBJECTIVE IMPAIRMENTS: {opptimpairments:25111}.   ACTIVITY LIMITATIONS: {activitylimitations:27494}  PARTICIPATION LIMITATIONS: {participationrestrictions:25113}  PERSONAL FACTORS: {Personal factors:25162} are also affecting patient's functional outcome.   REHAB POTENTIAL: {rehabpotential:25112}  CLINICAL DECISION MAKING: {clinical decision making:25114}  EVALUATION COMPLEXITY: {Evaluation complexity:25115}   GOALS: Goals reviewed with patient? Yes  SHORT TERM GOALS: (target date for Short term goals are 3 weeks ***)  1. Patient will demonstrate independent use of home exercise program to maintain progress from in clinic treatments.  Goal status: New  LONG TERM GOALS: (target dates for all long term goals are 10 weeks  *** )   1. Patient will demonstrate/report pain at worst less than or equal to 2/10 to facilitate minimal limitation in daily activity secondary to pain symptoms.  Goal status: New   2. Patient will demonstrate independent use of home exercise program to facilitate ability to maintain/progress functional gains from skilled physical therapy services.  Goal status: New   3. Patient will demonstrate FOTO outcome > or = *** % to indicate reduced disability due to condition.  Goal status:  New   4. Patient will demonstrate lumbar extension 100 % WFL s symptoms to facilitate upright standing, walking posture at PLOF s limitation.  Goal status: New   5.  ***  Goal status: New   6.  *** Goal status: New   7.  *** Goal Status: New  PLAN:  PT FREQUENCY: 1-2x/week  PT DURATION: 10 weeks  PLANNED INTERVENTIONS: Can include 16109- PT Re-evaluation, 97110-Therapeutic exercises, 97530- Therapeutic activity, [id number]- Neuromuscular re-education, 97535- Self Care, 97140- Manual therapy, [id number]- Gait training, [phone]- Orthotic Fit/training, [phone]- Canalith repositioning, [id number]- Aquatic Therapy, 97014- Electrical stimulation (unattended), 97750 Physical performance testing, [id number]- Electrical stimulation (manual), 97016- Vasopneumatic device, [id number]- Ultrasound, [id number]- Traction (mechanical), [id number]- Ionotophoresis 4mg /ml Dexamethasone, Patient/Family education, Balance training, Stair training, Taping, Dry Needling, Joint mobilization, Joint manipulation, Spinal manipulation, Spinal mobilization, Scar mobilization, Vestibular training, Visual/preceptual remediation/compensation, DME instructions, Cryotherapy, and Moist heat.  All performed as medically necessary.  All included unless contraindicated  PLAN FOR NEXT SESSION: Review HEP knowledge/results.    Patrick Solomon, Patrick Solomon, Student-PT 04/04/2023, 9:45 AM

## 2023-04-10 ENCOUNTER — Ambulatory Visit (AMBULATORY_SURGERY_CENTER): Payer: 59 | Admitting: *Deleted

## 2023-04-10 ENCOUNTER — Telehealth: Payer: Self-pay | Admitting: *Deleted

## 2023-04-10 VITALS — Ht 69.0 in | Wt 160.0 lb

## 2023-04-10 DIAGNOSIS — Z1211 Encounter for screening for malignant neoplasm of colon: Secondary | ICD-10-CM

## 2023-04-10 MED ORDER — SUFLAVE 178.7 G PO SOLR: 1.0000 | Freq: Once | ORAL | 0 refills | Status: AC

## 2023-04-10 NOTE — Telephone Encounter (Signed)
 Attempt to reach pt for pre-visit. LM with call back #.  Will attempt to reach again in 5 min due to no other # listed in profile Spoke witih pt

## 2023-04-10 NOTE — Progress Notes (Signed)
 Pt's name and DOB verified at the beginning of the pre-visit wit 2 identifiers  Permission given to speak with sister  Pt denies any difficulty with ambulating,sitting, laying down or rolling side to side  Pt has no issues with ambulation   Pt has no issues moving head neck or swallowing  No egg or soy allergy known to patient   No issues known to pt with past sedation with any surgeries or procedures  Patient denies ever being intubated  No FH of Malignant Hyperthermia  Pt is not on diet pills or shots  Pt is not on home 02   Pt is not on blood thinners   Pt denies issues with constipation   Pt is not on dialysis  Pt denise any abnormal heart rhythms   Pt denies any upcoming cardiac testing  Patient's chart reviewed by Cathlyn Parsons CNRA prior to pre-visit and patient appropriate for the [facility].  Pre-visit completed and red dot placed by patient's name on their procedure day (on provider's schedule).    Visit by phone  Pt states weight is 160 lb  IInstructions reviewed. Pt given [facility], [facility] main # and MD on call # prior to instructions.  Pt states understanding of instructions. Instructed to review again prior to procedure. Pt states they will.   Informed pt that they will receive a text or  call from [facility] regarding there prep med.

## 2023-04-15 ENCOUNTER — Encounter (INDEPENDENT_AMBULATORY_CARE_PROVIDER_SITE_OTHER): Payer: Self-pay | Admitting: Primary Care

## 2023-04-15 ENCOUNTER — Ambulatory Visit (INDEPENDENT_AMBULATORY_CARE_PROVIDER_SITE_OTHER): Admitting: Primary Care

## 2023-04-15 ENCOUNTER — Other Ambulatory Visit: Payer: Self-pay

## 2023-04-15 VITALS — BP 148/92 | HR 100 | Temp 98.3°F | Ht 69.0 in | Wt 155.4 lb

## 2023-04-15 DIAGNOSIS — I10 Essential (primary) hypertension: Secondary | ICD-10-CM

## 2023-04-15 DIAGNOSIS — R221 Localized swelling, mass and lump, neck: Secondary | ICD-10-CM | POA: Diagnosis not present

## 2023-04-15 MED ORDER — CEPHALEXIN 500 MG PO CAPS
500.0000 mg | ORAL_CAPSULE | Freq: Three times a day (TID) | ORAL | 0 refills | Status: DC
  Filled 2023-04-15: qty 30, 10d supply, fill #0

## 2023-04-15 MED ORDER — AMLODIPINE BESYLATE 10 MG PO TABS
10.0000 mg | ORAL_TABLET | Freq: Every day | ORAL | 1 refills | Status: DC
  Filled 2023-04-15: qty 30, 30d supply, fill #0

## 2023-04-15 NOTE — Progress Notes (Signed)
 Patient states he got his results back from his back, adnw hat you to look at results.

## 2023-04-15 NOTE — Progress Notes (Signed)
 [facility]  Patrick Solomon, is a 53 y.o. male  ZOX:[id number]  [id number]:[id number]  DOB - [birth year]  Chief Complaint  Patient presents with   Medical Management of Chronic Issues       Subjective:   Patrick Solomon is a 53 y.o. male here today for an acute visit.  Patient is in today questioning about results from his MRI of the lumbar spine advised to contact Ortho.  Today he is in for cervical pain.  Neck Pain  This is a chronic problem. The current episode started more than 1 year ago. The problem occurs constantly. The problem has been waxing and waning. The pain is associated with a sleep position. The pain is present in the midline. The quality of the pain is described as cramping. The pain is at a severity of 7/10. The pain is severe. The symptoms are aggravated by position, twisting and bending. The pain is Worse during the night. Stiffness is present At night. Associated symptoms include headaches. Associated symptoms comments: Quick sharp pain. He has tried nothing for the symptoms.  Patient has a cyst on his neck that does not have any drainage is red and swollen and tender causing difficulty to lay down and move his head.  Comprehensive ROS Pertinent positive and negative noted in HPI   Allergies  Allergen Reactions   Food     Pickles/Peanuts/Tuna Fish---Rash/Spots on body    Past Medical History:  Diagnosis Date   Arthritis    Asthma    Back pain    Depression    Hypertension    Sciatica    Sleep apnea    Substance abuse (HCC)    Has stopped cocaine for 6 month    Current Outpatient Medications on File Prior to Visit  Medication Sig Dispense Refill   albuterol (VENTOLIN HFA) 108 (90 Base) MCG/ACT inhaler Inhale 2 puffs into the lungs every 6 (six) hours as needed for wheezing or shortness of breath. (Patient not taking: Reported on 04/15/2023) 8 g 2   meloxicam (MOBIC) 15 MG tablet TAKE 1 TABLET (15 MG TOTAL) BY MOUTH DAILY. (Patient not taking:  Reported on 04/15/2023) 30 tablet 0   methocarbamol (ROBAXIN) 500 MG tablet Take 1 tablet (500 mg total) by mouth 3 (three) times daily. (Patient not taking: Reported on 04/15/2023) 90 tablet 0   No current facility-administered medications on file prior to visit.   Health Maintenance  Topic Date Due   Colon Cancer Screening  Never done   Flu Shot  05/06/2023*   Zoster (Shingles) Vaccine (1 of 2) 05/13/2023*   DTaP/Tdap/Td vaccine (2 - Td or Tdap) 04/12/2029   COVID-19 Vaccine  Completed   Hepatitis C Screening  Completed   HIV Screening  Completed   HPV Vaccine  Aged Out  *Topic was postponed. The date shown is not the original due date.    Objective:   Vitals:   04/15/23 1421 04/15/23 1451  BP: (!) 151/99 (!) 148/92  Pulse: 100   Temp: 98.3 F (36.8 C)   TempSrc: Oral   SpO2: 98%   Weight: 155 lb 6.4 oz (70.5 kg)   Height: 5\' 9"  (1.753 m)    BP Readings from Last 3 Encounters:  04/15/23 (!) 148/92  02/12/23 121/87  01/14/23 113/88      Physical Exam Vitals reviewed.  HENT:     Head: Normocephalic.     Right Ear: Tympanic membrane and external ear normal.     Left Ear: Tympanic  membrane and external ear normal.     Nose: Nose normal.  Eyes:     Extraocular Movements: Extraocular movements intact.     Pupils: Pupils are equal, round, and reactive to light.  Cardiovascular:     Rate and Rhythm: Normal rate and regular rhythm.  Pulmonary:     Effort: Pulmonary effort is normal.     Breath sounds: Normal breath sounds.  Abdominal:     General: Bowel sounds are normal. There is distension.     Palpations: Abdomen is soft.  Musculoskeletal:        General: Normal range of motion.  Skin:    General: Skin is warm and dry.     Comments: See picture   Neurological:     Mental Status: He is oriented to person, place, and time.  Psychiatric:        Mood and Affect: Mood normal.        Behavior: Behavior normal.        Thought Content: Thought content normal.         Judgment: Judgment normal.     Assessment & Plan  Tyr was seen today for medical management of chronic issues.  Diagnoses and all orders for this visit:  Mass in neck  -     cephALEXin (KEFLEX) 500 MG capsule; Take 1 capsule (500 mg total) by mouth 3 (three) times daily. If no improvement refer to general surgery.      Patient have been counseled extensively about nutrition and exercise. Other issues discussed during this visit include: low cholesterol diet, weight control and daily exercise, foot care, annual eye examinations at Ophthalmology, importance of adherence with medications and regular follow-up. We also discussed long term complications of uncontrolled diabetes and hypertension.   Return in about 3 months (around 07/16/2023) for fasting labs/HTN.  The patient was given clear instructions to go to ER or return to medical center if symptoms don't improve, worsen or new problems develop. The patient verbalized understanding. The patient was told to call to get lab results if they haven't heard anything in the next week.   This note has been created with Education officer, environmental. Any transcriptional errors are unintentional.   Grayce Sessions, NP 04/15/2023, 3:01 PM

## 2023-04-22 ENCOUNTER — Ambulatory Visit (INDEPENDENT_AMBULATORY_CARE_PROVIDER_SITE_OTHER): Payer: 59 | Admitting: Physical Therapy

## 2023-04-22 ENCOUNTER — Encounter: Payer: Self-pay | Admitting: Physical Therapy

## 2023-04-22 DIAGNOSIS — M542 Cervicalgia: Secondary | ICD-10-CM

## 2023-04-22 DIAGNOSIS — M6281 Muscle weakness (generalized): Secondary | ICD-10-CM | POA: Diagnosis not present

## 2023-04-22 DIAGNOSIS — M5459 Other low back pain: Secondary | ICD-10-CM | POA: Diagnosis not present

## 2023-04-22 NOTE — Patient Instructions (Signed)

## 2023-04-22 NOTE — Therapy (Unsigned)
 OUTPATIENT PHYSICAL THERAPY EVALUATION   Patient Name: Patrick Solomon MRN: [id number] DOB:[birth year], 53 y.o., male Today's Date: 04/22/2023  END OF SESSION:  PT End of Session - 04/22/23 1556     Visit Number 1    Number of Visits 12    Date for PT Re-Evaluation 06/17/23    PT Start Time 1515    PT Stop Time 1600    PT Time Calculation (min) 45 min    Activity Tolerance Patient tolerated treatment well    Behavior During Therapy WFL for tasks assessed/performed             Past Medical History:  Diagnosis Date   Arthritis    Asthma    Back pain    Depression    Hypertension    Sciatica    Sleep apnea    Substance abuse (HCC)    Has stopped cocaine for 6 month   Past Surgical History:  Procedure Laterality Date   staple placement     several stitiches requiring anes   Patient Active Problem List   Diagnosis Date Noted   Cocaine abuse (HCC) 06/17/2019   Substance induced mood disorder (HCC) 06/17/2019    PCP: Grayce Sessions NP  REFERRING PROVIDER: Juanda Chance, NP  REFERRING DIAG: M54.16 (ICD-10-CM) - Lumbar radiculopathy M54.2 (ICD-10-CM) - Cervicalgia  Rationale for Evaluation and Treatment: Rehabilitation  THERAPY DIAG:  Other low back pain  Cervicalgia  Muscle weakness (generalized)  ONSET DATE: chronic pain for last 10 years  SUBJECTIVE:                                                                                                                                                                                           SUBJECTIVE STATEMENT: He relays 10 years ago he was lifting weights doing deadlifts and squats and he injured his back and has been in pain since. His neck has been bothering him for last 4 years and is real stiff. Pain is worse at night and with work. His pain can run down the legs with N/T. Sometimes he gets sharp ligthning pains that shoot to his brain but does not last long.  PERTINENT HISTORY:  See above  PMH  PAIN:  NPRS scale: 7/10 back, 5/10 neck Pain location: cervical:center or neck  lumbar:  center of low and mid back Pain description: sharp Aggravating factors: neck: lateral flexion, sleeping. Back: lifting, twisting Relieving factors: pain meds, sitting in reclined postion  PRECAUTIONS: None  WEIGHT BEARING RESTRICTIONS: No  FALLS:  Has patient fallen in last 6 months? No  OCCUPATION: cook  PLOF: Independent  PATIENT GOALS:  reduce pain  Next MD Visit:    OBJECTIVE:  Diagnostic findings MRI LUMBAR SPINE WITHOUT CONTRAST  FINDINGS: Segmentation:  Normal.   Alignment:  No significant listhesis.   Vertebrae: Vertebral body heights are maintained. No marrow edema. No suspicious lesion.   Conus medullaris and cauda equina: Conus extends to the L1 level. Conus and cauda equina appear normal.   Paraspinal and other soft tissues: Unremarkable.   Disc levels: Disc desiccation at L2-L3, L3-L4, and L4-L5   L1-L2:  No stenosis.   L2-L3:  Shallow left foraminal protrusion. No stenosis.   L3-L4: Disc bulge eccentric to the right. Mild facet arthropathy. Mild canal stenosis. Partial effacement of subarticular recesses. Mild right and minor left foraminal stenosis.   L4-L5: Disc bulge with punctate central annular fissure. Mild facet arthropathy. Mild canal stenosis. Partial effacement of the left subarticular recess. Minor right and mild left foraminal stenosis.   L5-S1:  No stenosis.   IMPRESSION: Overall mild degenerative changes as detailed above without high-grade stenosis.   PATIENT SURVEYS:  03/12/2023 FOTO eval:    predicted:    SCREENING FOR RED FLAGS: 03/12/2023 Bowel or bladder incontinence: No Cauda equina syndrome: No  COGNITION: 03/12/2023 Overall cognitive status: WFL normal      Patient-Specific Activity Scoring Scheme  "0" represents "unable to perform." "10" represents "able to perform at prior level. 0 1 2 3 4 5 6 7 8 9  10 (Date and  Score)   Activity Eval     1. Bending over 2     2. Lifting 40# or more 0     Score 2/20    Total score = sum of the activity scores/number of activities Minimum detectable change (90%CI) for average score = 2 points Minimum detectable change (90%CI) for single activity score = 3 points     CERVICAL ROM:   Active ROM A/PROM (deg) 03/12/2023  Flexion 75%  Extension 10%  Right lateral flexion 25%  Left lateral flexion 25%  Right rotation 75%  Left rotation 50%   (Blank rows = not tested)   LUMBAR ROM:  03/12/2023 Directional Preference Assessment:Pain worse with both flexion and extension  AROM 03/12/2023  Flexion 75% and pain  Extension 25% and pain  Right lateral flexion WNL  Left lateral flexion WNL  Right rotation WNL  Left rotation WNL   (Blank rows = not tested)   LOWER EXTREMITY MMT:    MMT Right 03/12/2023 Left 03/12/2023  Hip flexion 4 4  Hip extension 4 4  Hip abduction 4 4  Hip adduction    Hip internal rotation    Hip external rotation    Knee flexion 5 5  Knee extension 5 5  Ankle dorsiflexion    Ankle plantarflexion    Ankle inversion    Ankle eversion     (Blank rows = not tested)  LUMBAR SPECIAL TESTS:  03/12/2023 Straight leg raise test: Negative and Slump test: Negative  TODAY'S TREATMENT:                                                                                                         DATE: 03/12/2023  Therex:    HEP instruction/performance c cues for techniques, handout provided.  Trial set performed of each for comprehension and symptom assessment.  See below for exercise list Trigger Point Dry Needling (not included in billable time) Initial Treatment: Pt instructed on Dry Needling rational, procedures, and possible side  effects. Pt instructed to expect mild to moderate muscle soreness later in the day and/or into the next day.  Pt instructed in methods to reduce muscle soreness. Pt instructed to continue prescribed HEP. Patient verbalized understanding of these instructions and education.  Patient Verbal Consent Given: Yes Education Handout Provided: Yes Muscles Treated:  Lumbar/cervical paraspinals/multifidi Electrical Stimulation Performed: No Treatment Response/Outcome:  good overall tolerance,twitch response noted   Moist heat X 10 min to low back and neck after DN treatment during HEP creation and education    PATIENT EDUCATION:  03/12/2023 Education details: HEP, POC Person educated: Patient Education method: Programmer, multimedia, Demonstration, Verbal cues, and Handouts Education comprehension: verbalized understanding, returned demonstration, and verbal cues required  HOME EXERCISE PROGRAM: Access Code: [email or web address] URL: [email or web address] Date: 04/23/2023 Prepared by: Ivery Quale  Exercises - Seated Assisted Cervical Rotation with Towel  - 2 x daily - 6 x weekly - 1-2 sets - 10 reps - 5 hold - Seated Cervical Sidebending Stretch  - 2 x daily - 6 x weekly - 1 sets - 3 reps - 30 sec hold - Standing Shoulder Row with Anchored Resistance  - 2 x daily - 6 x weekly - 2 sets - 10-15 reps - Shoulder Extension with Resistance - Neutral  - 2 x daily - 6 x weekly - 2 sets - 10-15 reps - Standing Lumbar Extension at Wall - Forearms  - 2 x daily - 6 x weekly - 1 sets - 10 reps - 3 sec hold - Supine Bridge  - 2 x daily - 6 x weekly - 1-2 sets - 10 reps - 5 hold  ASSESSMENT:  CLINICAL IMPRESSION: Patient is a 53 y.o. who comes to clinic with complaints of chronic neck and back pain with mobility, strength and movement coordination deficits that impair their ability to perform usual daily and recreational functional activities without increase difficulty/symptoms at this time.  Patient to benefit  from skilled PT services to address impairments and limitations to improve to previous level of function without restriction secondary to condition.   OBJECTIVE IMPAIRMENTS: decreased activity tolerance, decreased mobility, difficulty walking, decreased ROM, decreased strength, increased muscle spasms, impaired flexibility, and pain.   ACTIVITY LIMITATIONS: carrying, lifting, bending, sitting, standing, squatting, and sleeping  PARTICIPATION LIMITATIONS: meal prep, cleaning, driving, shopping, community activity, occupation, and yard work  PERSONAL FACTORS: Past/current experiences, Time since onset of injury/illness/exacerbation, and 1-2 comorbidities:    are also affecting patient's functional outcome.   REHAB POTENTIAL: Good  CLINICAL DECISION MAKING: Evolving/moderate complexity  EVALUATION COMPLEXITY: Moderate   GOALS:  SHORT TERM GOALS: (target date for Short term goals are 3 weeks 05/20/2023  )  1. Patient will demonstrate independent use of home exercise program to maintain progress from in clinic treatments.  Goal status: New  LONG TERM GOALS: (target dates for all long term goals are 10 weeks  06/17/2023   )   1. Patient will demonstrate/report pain at worst less than or equal to 2/10 to facilitate minimal limitation in daily activity secondary to pain symptoms.  Goal status: New   2. Patient will demonstrate independent use of home exercise program to facilitate ability to maintain/progress functional gains from skilled physical therapy services.  Goal status: New   3. Patient will demonstrate PSFS scale improved to at lest 10/20 functional to indicate reduced disability due to condition.  Goal status: New   4. Patient will demonstrate lumbar and cervial ROM to [facility] at least 75% all planes Goal status: New   5.  Pt will improve bilat hip strength to 4+/5 MMT to show improved functional strength.  Goal status: New   PLAN:  PT FREQUENCY: 1-2x/week  PT  DURATION: 8 weeks  PLANNED INTERVENTIONS: Can include 16109- PT Re-evaluation, 97110-Therapeutic exercises, 97530- Therapeutic activity, [id number]- Neuromuscular re-education, 97535- Self Care, 97140- Manual therapy, [phone]- Gait training, [phone]- Orthotic Fit/training, [phone]- Canalith repositioning, [id number]- Aquatic Therapy, 97014- Electrical stimulation (unattended), 97750 Physical performance testing, [id number]- Electrical stimulation (manual), 97016- Vasopneumatic device, [id number]- Ultrasound, [id number]- Traction (mechanical), [id number]- Ionotophoresis 4mg /ml Dexamethasone, Patient/Family education, Balance training, Stair training, Taping, Dry Needling, Joint mobilization, Joint manipulation, Spinal manipulation, Spinal mobilization, Scar mobilization, Vestibular training, Visual/preceptual remediation/compensation, DME instructions, Cryotherapy, and Moist heat.  All performed as medically necessary.  All included unless contraindicated  PLAN FOR NEXT SESSION: Review HEP knowledge/results. How was DN?   April Manson, PT,DPT 04/22/2023, 3:58 PM

## 2023-04-23 ENCOUNTER — Encounter: Payer: Self-pay | Admitting: Internal Medicine

## 2023-04-29 ENCOUNTER — Ambulatory Visit (INDEPENDENT_AMBULATORY_CARE_PROVIDER_SITE_OTHER)

## 2023-04-29 ENCOUNTER — Encounter: Payer: Self-pay | Admitting: Internal Medicine

## 2023-04-29 ENCOUNTER — Ambulatory Visit (AMBULATORY_SURGERY_CENTER): Payer: 59 | Admitting: Internal Medicine

## 2023-04-29 VITALS — BP 132/90 | HR 74 | Temp 97.7°F | Resp 15 | Ht 69.0 in | Wt 160.0 lb

## 2023-04-29 DIAGNOSIS — K635 Polyp of colon: Secondary | ICD-10-CM | POA: Diagnosis not present

## 2023-04-29 DIAGNOSIS — K648 Other hemorrhoids: Secondary | ICD-10-CM | POA: Diagnosis not present

## 2023-04-29 DIAGNOSIS — G4730 Sleep apnea, unspecified: Secondary | ICD-10-CM | POA: Diagnosis not present

## 2023-04-29 DIAGNOSIS — K6389 Other specified diseases of intestine: Secondary | ICD-10-CM | POA: Diagnosis not present

## 2023-04-29 DIAGNOSIS — K5730 Diverticulosis of large intestine without perforation or abscess without bleeding: Secondary | ICD-10-CM | POA: Diagnosis not present

## 2023-04-29 DIAGNOSIS — Z1211 Encounter for screening for malignant neoplasm of colon: Secondary | ICD-10-CM | POA: Diagnosis not present

## 2023-04-29 DIAGNOSIS — F32A Depression, unspecified: Secondary | ICD-10-CM | POA: Diagnosis not present

## 2023-04-29 DIAGNOSIS — D122 Benign neoplasm of ascending colon: Secondary | ICD-10-CM | POA: Diagnosis not present

## 2023-04-29 DIAGNOSIS — I10 Essential (primary) hypertension: Secondary | ICD-10-CM | POA: Diagnosis not present

## 2023-04-29 DIAGNOSIS — J45909 Unspecified asthma, uncomplicated: Secondary | ICD-10-CM | POA: Diagnosis not present

## 2023-04-29 MED ORDER — SODIUM CHLORIDE 0.9 % IV SOLN: 500.0000 mL | Freq: Once | INTRAVENOUS | Status: DC

## 2023-04-29 NOTE — Progress Notes (Signed)
 GASTROENTEROLOGY PROCEDURE H&P NOTE   Primary Care Physician: Grayce Sessions, NP    Reason for Procedure:   Colon cancer screening  Plan:    Colonoscopy  Patient is appropriate for endoscopic procedure(s) in the ambulatory ([facility]) setting.  The nature of the procedure, as well as the risks, benefits, and alternatives were carefully and thoroughly reviewed with the patient. Ample time for discussion and questions allowed. The patient understood, was satisfied, and agreed to proceed.     HPI: Patrick Solomon is a 53 y.o. male who presents for colonoscopy for colon cancer screening. Denies blood in stools, changes in bowel habits, or unintentional weight loss. Denies family history of colon cancer.   Past Medical History:  Diagnosis Date   Arthritis    Asthma    Back pain    Depression    Hypertension    Sciatica    Sleep apnea    Substance abuse (HCC)    Has stopped cocaine for 6 month    Past Surgical History:  Procedure Laterality Date   staple placement     several stitiches requiring anes    Prior to Admission medications   Medication Sig Start Date End Date Taking? Authorizing Provider  albuterol (VENTOLIN HFA) 108 (90 Base) MCG/ACT inhaler Inhale 2 puffs into the lungs every 6 (six) hours as needed for wheezing or shortness of breath. Patient not taking: Reported on 04/15/2023 02/12/23   Grayce Sessions, NP  amLODipine (NORVASC) 10 MG tablet Take 1 tablet (10 mg total) by mouth daily. 04/15/23   Grayce Sessions, NP  cephALEXin (KEFLEX) 500 MG capsule Take 1 capsule (500 mg total) by mouth 3 (three) times daily. 04/15/23   Grayce Sessions, NP  meloxicam (MOBIC) 15 MG tablet TAKE 1 TABLET (15 MG TOTAL) BY MOUTH DAILY. Patient not taking: Reported on 04/15/2023 03/28/23 03/27/24  Juanda Chance, NP  methocarbamol (ROBAXIN) 500 MG tablet Take 1 tablet (500 mg total) by mouth 3 (three) times daily. Patient not taking: Reported on 04/15/2023 02/27/23    Juanda Chance, NP    Current Outpatient Medications  Medication Sig Dispense Refill   amLODipine (NORVASC) 10 MG tablet Take 1 tablet (10 mg total) by mouth daily. 90 tablet 1   cephALEXin (KEFLEX) 500 MG capsule Take 1 capsule (500 mg total) by mouth 3 (three) times daily. 30 capsule 0   albuterol (VENTOLIN HFA) 108 (90 Base) MCG/ACT inhaler Inhale 2 puffs into the lungs every 6 (six) hours as needed for wheezing or shortness of breath. (Patient not taking: Reported on 04/29/2023) 8 g 2   Current Facility-Administered Medications  Medication Dose Route Frequency Provider Last Rate Last Admin   0.9 %  sodium chloride infusion  500 mL Intravenous Once Imogene Burn, MD        Allergies as of 04/29/2023 - Review Complete 04/29/2023  Allergen Reaction Noted   Food Rash 04/14/2014    Family History  Problem Relation Age of Onset   Alcohol abuse Mother    Cirrhosis Mother    Cancer Mother        unknown type cancer   Hypertension Father    Colon polyps Neg Hx    Crohn's disease Neg Hx    Esophageal cancer Neg Hx    Rectal cancer Neg Hx    Stomach cancer Neg Hx    Colon cancer Neg Hx     Social History   Socioeconomic History   Marital status: Significant  Other    Spouse name: Not on file   Number of children: 12   Years of education: 12   Highest education level: High school graduate  Occupational History   Not on file  Tobacco Use   Smoking status: Former    Current packs/day: 0.50    Average packs/day: 0.5 packs/day for 29.0 years (14.5 ttl pk-yrs)    Types: Cigarettes   Smokeless tobacco: Never   Tobacco comments:    Information for Quitline given  Vaping Use   Vaping status: Every Day  Substance and Sexual Activity   Alcohol use: Yes    Comment: States drinks once weekly on weekend-beer   Drug use: Not Currently    Types: Codeine    Comment: Stopped 6 month   Sexual activity: Not on file  Other Topics Concern   Not on file  Social History Narrative    Not on file   Social Drivers of Health   Financial Resource Strain: Low Risk  (12/13/2017)   Overall Financial Resource Strain (CARDIA)    Difficulty of Paying Living Expenses: Not hard at all  Food Insecurity: No Food Insecurity (02/12/2023)   Hunger Vital Sign    Worried About Running Out of Food in the Last Year: Never true    Ran Out of Food in the Last Year: Never true  Transportation Needs: No Transportation Needs (02/12/2023)   PRAPARE - Administrator, Civil Service (Medical): No    Lack of Transportation (Non-Medical): No  Physical Activity: Not on file  Stress: Not on file  Social Connections: Not on file  Intimate Partner Violence: Not At Risk (02/12/2023)   Humiliation, Afraid, Rape, and Kick questionnaire    Fear of Current or Ex-Partner: No    Emotionally Abused: No    Physically Abused: No    Sexually Abused: No    Physical Exam: Vital signs in last 24 hours: BP 101/62   Pulse 88   Temp 97.7 F (36.5 C) (Temporal)   Ht 5\' 9"  (1.753 m)   Wt 160 lb (72.6 kg)   SpO2 99%   BMI 23.63 kg/m  GEN: NAD EYE: Sclerae anicteric ENT: MMM CV: Non-tachycardic Pulm: No increased work of breathing GI: Soft, NT/ND NEURO:  Alert & Oriented   Eulah Pont, MD [facility]  04/29/2023 3:57 PM

## 2023-04-29 NOTE — Patient Instructions (Addendum)

## 2023-04-29 NOTE — Progress Notes (Signed)
 Called to room to assist during endoscopic procedure.  Patient ID and intended procedure confirmed with present staff. Received instructions for my participation in the procedure from the performing physician.

## 2023-04-29 NOTE — Op Note (Signed)
 [facility] Patient Name: Patrick Solomon Procedure Date: 04/29/2023 3:31 PM MRN: [id number] Endoscopist: Particia Lather , , [id number] Age: 53 Referring MD:  Date of Birth: 1970-02-17 Gender: Male Account #: [id number] Procedure:                Colonoscopy Indications:              Screening for colorectal malignant neoplasm, This                            is the patient's first colonoscopy Medicines:                Monitored Anesthesia Care Procedure:                Pre-Anesthesia Assessment:                           - Prior to the procedure, a History and Physical                            was performed, and patient medications and                            allergies were reviewed. The patient's tolerance of                            previous anesthesia was also reviewed. The risks                            and benefits of the procedure and the sedation                            options and risks were discussed with the patient.                            All questions were answered, and informed consent                            was obtained. Prior Anticoagulants: The patient has                            taken no anticoagulant or antiplatelet agents. ASA                            Grade Assessment: II - A patient with mild systemic                            disease. After reviewing the risks and benefits,                            the patient was deemed in satisfactory condition to                            undergo the procedure.  After obtaining informed consent, the colonoscope                            was passed under direct vision. Throughout the                            procedure, the patient's blood pressure, pulse, and                            oxygen saturations were monitored continuously. The                            Olympus Scope SN: [id number] was introduced through                            the anus and  advanced to the the terminal ileum.                            The colonoscopy was performed without difficulty.                            The patient tolerated the procedure well. The                            quality of the bowel preparation was good. The                            terminal ileum, ileocecal valve, appendiceal                            orifice, and rectum were photographed. Scope In: 4:02:52 PM Scope Out: 4:15:45 PM Scope Withdrawal Time: 0 hours 10 minutes 13 seconds  Total Procedure Duration: 0 hours 12 minutes 53 seconds  Findings:                 The terminal ileum appeared normal.                           A 3 mm polyp was found in the ascending colon. The                            polyp was sessile. The polyp was removed with a                            cold snare. Resection and retrieval were complete.                           Multiple diverticula were found in the ascending                            colon.                           Non-bleeding internal hemorrhoids were found during  retroflexion. Complications:            No immediate complications. Estimated Blood Loss:     Estimated blood loss was minimal. Impression:               - The examined portion of the ileum was normal.                           - One 3 mm polyp in the ascending colon, removed                            with a cold snare. Resected and retrieved.                           - Diverticulosis in the ascending colon.                           - Non-bleeding internal hemorrhoids. Recommendation:           - Discharge patient to home (with escort).                           - Await pathology results.                           - The findings and recommendations were discussed                            with the patient. Dr Particia Lather "Alan Ripper" Leonides Schanz,  04/29/2023 4:19:34 PM

## 2023-04-29 NOTE — Progress Notes (Signed)
 A/O x 3, gd SR's, VSS, report to RN

## 2023-04-29 NOTE — Progress Notes (Signed)
 Pt's states no medical or surgical changes since previsit or office visit.

## 2023-04-30 ENCOUNTER — Telehealth: Payer: Self-pay

## 2023-04-30 NOTE — Telephone Encounter (Signed)
 Follow up call to pt, lm for pt to call if having any difficulty with normal activities or eating and drinking.  Also to call if any other questions or concerns.

## 2023-05-02 ENCOUNTER — Encounter: Payer: Self-pay | Admitting: Internal Medicine

## 2023-05-02 LAB — SURGICAL PATHOLOGY

## 2023-05-07 ENCOUNTER — Encounter: Admitting: Physical Therapy

## 2023-05-07 ENCOUNTER — Telehealth: Payer: Self-pay | Admitting: Physical Therapy

## 2023-05-07 NOTE — Telephone Encounter (Signed)
 I called pt to follow up after he didn't show for his 2:30 PT appointment. I called pt at [phone] which is his new phone number offer by his wife. Pt stating his work schedule was changed and he was unable to make his appointment today. Pt was reminded of his next appointment on Tuesday 05/13/23 at 3:15 and told if he was unable to make it to call and let us know.   Narda Amber, PT, MPT 05/07/23 3:08 PM

## 2023-05-13 ENCOUNTER — Encounter: Admitting: Rehabilitative and Restorative Service Providers"

## 2023-05-13 ENCOUNTER — Telehealth: Payer: Self-pay | Admitting: Rehabilitative and Restorative Service Providers"

## 2023-05-13 NOTE — Telephone Encounter (Signed)
 Called patient after no show for today's appointment.  2nd no show in a row.  Reviewed with patient the clinic policy of scheduling one visit at a time due to multiple no shows.  Cancelled additional scheduled visits past the next on on 05/20/2023.  Pt reminded of appointment time.    Chyrel Masson, PT, DPT, OCS, ATC 05/13/23  3:38 PM

## 2023-05-20 ENCOUNTER — Encounter: Admitting: Rehabilitative and Restorative Service Providers"

## 2023-05-27 ENCOUNTER — Encounter: Admitting: Rehabilitative and Restorative Service Providers"

## 2023-06-03 ENCOUNTER — Encounter: Admitting: Rehabilitative and Restorative Service Providers"

## 2023-06-10 ENCOUNTER — Encounter: Admitting: Rehabilitative and Restorative Service Providers"

## 2023-06-17 ENCOUNTER — Encounter: Admitting: Rehabilitative and Restorative Service Providers"

## 2023-06-24 ENCOUNTER — Encounter: Admitting: Physical Therapy

## 2023-07-15 ENCOUNTER — Telehealth (INDEPENDENT_AMBULATORY_CARE_PROVIDER_SITE_OTHER): Payer: Self-pay | Admitting: Primary Care

## 2023-07-15 NOTE — Telephone Encounter (Signed)
 Called pt but VM is unavailable.

## 2023-07-16 ENCOUNTER — Telehealth (INDEPENDENT_AMBULATORY_CARE_PROVIDER_SITE_OTHER): Payer: Self-pay | Admitting: Primary Care

## 2023-07-16 ENCOUNTER — Ambulatory Visit (INDEPENDENT_AMBULATORY_CARE_PROVIDER_SITE_OTHER): Payer: Self-pay | Admitting: Primary Care

## 2023-07-16 NOTE — Telephone Encounter (Signed)
 Called pt to attempt to reschedule missed appt. Pt's phone was unavailable.

## 2023-07-30 ENCOUNTER — Telehealth (INDEPENDENT_AMBULATORY_CARE_PROVIDER_SITE_OTHER): Payer: Self-pay | Admitting: Primary Care

## 2023-07-30 NOTE — Telephone Encounter (Signed)
 Called pt to confirm appt. Pt's phone is unavailable.

## 2023-08-06 ENCOUNTER — Ambulatory Visit (INDEPENDENT_AMBULATORY_CARE_PROVIDER_SITE_OTHER): Admitting: Primary Care

## 2023-08-21 ENCOUNTER — Ambulatory Visit (INDEPENDENT_AMBULATORY_CARE_PROVIDER_SITE_OTHER): Admitting: Primary Care

## 2023-09-10 ENCOUNTER — Ambulatory Visit: Admitting: Dermatology

## 2023-11-29 ENCOUNTER — Telehealth (HOSPITAL_COMMUNITY): Payer: Self-pay | Admitting: Licensed Clinical Social Worker

## 2023-11-29 ENCOUNTER — Telehealth (HOSPITAL_COMMUNITY): Payer: Self-pay

## 2023-11-29 NOTE — Telephone Encounter (Signed)
 Adain calls and leaves a message requesting a return call for assistance with Substance Use. This therapist calls him back and he answers. Therapist confirms his identity by obtaining two verifiers. Therapist inquires as to what type of substance use treatment he is looking for.  Therapist explains residential, SA IOP and individual. He says because of his work schedule he cannot come to SA IOP as he works from [place] to [employer]. He says he never knows his schedule from week to week.  Therapist explains it would be difficult to schedule him with her as her appointments are out usually 6 weeks.  He says he wants to be seen x 3 weekly. Therapist explains that individual does not meet that often. He says he will settle for once per week. He says he no longer has any insurance, so he would need to enrolled in [facility]. Therapist explains she will speak to her peer, Elsie Maier to see if he can accommodate this request.   Darice Simpler, MS, LMFT, LCAS

## 2023-11-29 NOTE — Telephone Encounter (Signed)
 Patrick Solomon calls and leaves a message requesting a return call for assistance with Substance Use. This therapist calls him back and he answers. Therapist confirms his identity by obtaining two verifiers. Therapist inquires as to what type of substance use treatment he is looking for.  Therapist explains residential, SA IOP and individual. He says because of his work schedule he cannot come to SA IOP as he works from [place] to [employer]. He says he never knows his schedule from week to week.  Therapist explains it would be difficult to schedule him with her as her appointments are out usually 6 weeks.  He says he wants to be seen x 3 weekly. Therapist explains that individual does not meet that often. He says he will settle for once per week. He says he no longer has any insurance, so he would need to enrolled in [facility]. Therapist explains she will speak to her peer, Elsie Maier to see if he can accommodate this request.   Patrick Simpler, MS, LMFT, LCAS

## 2023-12-09 ENCOUNTER — Other Ambulatory Visit (HOSPITAL_COMMUNITY): Admission: EM | Admit: 2023-12-09 | Discharge: 2023-12-09 | Attending: Psychiatry | Admitting: Psychiatry

## 2023-12-09 ENCOUNTER — Other Ambulatory Visit (HOSPITAL_COMMUNITY)
Admission: EM | Admit: 2023-12-09 | Discharge: 2023-12-14 | Disposition: A | Discharge status: Home / Self Care | Source: Home / Self Care | Admitting: Family

## 2023-12-09 DIAGNOSIS — I10 Essential (primary) hypertension: Secondary | ICD-10-CM | POA: Insufficient documentation

## 2023-12-09 DIAGNOSIS — F1414 Cocaine abuse with cocaine-induced mood disorder: Secondary | ICD-10-CM

## 2023-12-09 DIAGNOSIS — M549 Dorsalgia, unspecified: Secondary | ICD-10-CM | POA: Diagnosis not present

## 2023-12-09 DIAGNOSIS — Z79899 Other long term (current) drug therapy: Secondary | ICD-10-CM | POA: Insufficient documentation

## 2023-12-09 DIAGNOSIS — N400 Enlarged prostate without lower urinary tract symptoms: Secondary | ICD-10-CM | POA: Insufficient documentation

## 2023-12-09 DIAGNOSIS — F1410 Cocaine abuse, uncomplicated: Secondary | ICD-10-CM | POA: Insufficient documentation

## 2023-12-09 DIAGNOSIS — M5430 Sciatica, unspecified side: Secondary | ICD-10-CM | POA: Insufficient documentation

## 2023-12-09 DIAGNOSIS — F17200 Nicotine dependence, unspecified, uncomplicated: Secondary | ICD-10-CM | POA: Insufficient documentation

## 2023-12-09 DIAGNOSIS — F419 Anxiety disorder, unspecified: Secondary | ICD-10-CM | POA: Insufficient documentation

## 2023-12-09 DIAGNOSIS — E559 Vitamin D deficiency, unspecified: Secondary | ICD-10-CM

## 2023-12-09 DIAGNOSIS — R062 Wheezing: Secondary | ICD-10-CM

## 2023-12-09 LAB — COMPREHENSIVE METABOLIC PANEL WITH GFR
ALT: 19 U/L (ref 0–44)
AST: 28 U/L (ref 15–41)
Albumin: 4.1 g/dL (ref 3.5–5.0)
Alkaline Phosphatase: 50 U/L (ref 38–126)
Anion gap: 12 (ref 5–15)
BUN: 12 mg/dL (ref 6–20)
CO2: 24 mmol/L (ref 22–32)
Calcium: 9.2 mg/dL (ref 8.9–10.3)
Chloride: 102 mmol/L (ref 98–111)
Creatinine, Ser: 1.07 mg/dL (ref 0.61–1.24)
GFR, Estimated: >60 mL/min (ref >60)
Glucose, Bld: 77 mg/dL (ref 70–99)
Potassium: 4.8 mmol/L (ref 3.5–5.1)
Sodium: 138 mmol/L (ref 135–145)
Total Bilirubin: 0.3 mg/dL (ref 0.0–1.2)
Total Protein: 7.7 g/dL (ref 6.5–8.1)

## 2023-12-09 LAB — TSH: TSH: 1.964 u[IU]/mL (ref 0.350–4.500)

## 2023-12-09 LAB — CBC WITH DIFFERENTIAL/PLATELET
Abs Immature Granulocytes: 0.02 K/uL (ref 0.00–0.07)
Basophils Absolute: 0 K/uL (ref 0.0–0.1)
Basophils Relative: 1 %
Eosinophils Absolute: 0 K/uL (ref 0.0–0.5)
Eosinophils Relative: 0 %
HCT: 45.8 % (ref 39.0–52.0)
Hemoglobin: 15 g/dL (ref 13.0–17.0)
Immature Granulocytes: 0 %
Lymphocytes Relative: 34 %
Lymphs Abs: 1.8 K/uL (ref 0.7–4.0)
MCH: 28.6 pg (ref 26.0–34.0)
MCHC: 32.8 g/dL (ref 30.0–36.0)
MCV: 87.2 fL (ref 80.0–100.0)
Monocytes Absolute: 1.2 K/uL — ABNORMAL HIGH (ref 0.1–1.0)
Monocytes Relative: 22 %
Neutro Abs: 2.3 K/uL (ref 1.7–7.7)
Neutrophils Relative %: 43 %
Platelets: 292 K/uL (ref 150–400)
RBC: 5.25 MIL/uL (ref 4.22–5.81)
RDW: 13.2 % (ref 11.5–15.5)
WBC: 5.3 K/uL (ref 4.0–10.5)
nRBC: 0 % (ref 0.0–0.2)

## 2023-12-09 LAB — POCT URINE DRUG SCREEN - MANUAL ENTRY (I-SCREEN)
POC Amphetamine UR: NOT DETECTED
POC Buprenorphine (BUP): NOT DETECTED
POC Cocaine UR: NOT DETECTED
POC Marijuana UR: POSITIVE — AB
POC Methadone UR: NOT DETECTED
POC Methamphetamine UR: NOT DETECTED
POC Morphine: NOT DETECTED
POC Oxazepam (BZO): NOT DETECTED
POC Oxycodone UR: NOT DETECTED
POC Secobarbital (BAR): NOT DETECTED

## 2023-12-09 LAB — ETHANOL: Alcohol, Ethyl (B): <15 mg/dL (ref <15)

## 2023-12-09 LAB — MAGNESIUM: Magnesium: 2.1 mg/dL (ref 1.7–2.4)

## 2023-12-09 MED ORDER — DIPHENHYDRAMINE HCL 50 MG/ML IJ SOLN: 50.0000 mg | Freq: Three times a day (TID) | INTRAMUSCULAR | Status: DC | PRN

## 2023-12-09 MED ORDER — HALOPERIDOL LACTATE 5 MG/ML IJ SOLN: 10.0000 mg | Freq: Three times a day (TID) | INTRAMUSCULAR | Status: DC | PRN

## 2023-12-09 MED ORDER — DIPHENHYDRAMINE HCL 50 MG PO CAPS: 50.0000 mg | ORAL_CAPSULE | Freq: Three times a day (TID) | ORAL | Status: DC | PRN

## 2023-12-09 MED ORDER — ALUM & MAG HYDROXIDE-SIMETH 200-200-20 MG/5ML PO SUSP: 30.0000 mL | ORAL | Status: DC | PRN

## 2023-12-09 MED ORDER — AMLODIPINE BESYLATE 10 MG PO TABS: 10.0000 mg | ORAL_TABLET | Freq: Every day | ORAL | Status: DC

## 2023-12-09 MED ORDER — LORAZEPAM 2 MG/ML IJ SOLN: 2.0000 mg | Freq: Three times a day (TID) | INTRAMUSCULAR | Status: DC | PRN

## 2023-12-09 MED ORDER — HALOPERIDOL 5 MG PO TABS: 5.0000 mg | ORAL_TABLET | Freq: Three times a day (TID) | ORAL | Status: DC | PRN

## 2023-12-09 MED ORDER — HALOPERIDOL LACTATE 5 MG/ML IJ SOLN: 5.0000 mg | Freq: Three times a day (TID) | INTRAMUSCULAR | Status: DC | PRN

## 2023-12-09 MED ORDER — TRAZODONE HCL 50 MG PO TABS
50.0000 mg | ORAL_TABLET | Freq: Every evening | ORAL | Status: DC | PRN
  Administered 2023-12-11 – 2023-12-13 (×3): 50 mg via ORAL
  Filled 2023-12-09 (×3): qty 1

## 2023-12-09 MED ORDER — MAGNESIUM HYDROXIDE 400 MG/5ML PO SUSP: 30.0000 mL | Freq: Every day | ORAL | Status: DC | PRN

## 2023-12-09 MED ORDER — HYDROXYZINE HCL 25 MG PO TABS
25.0000 mg | ORAL_TABLET | Freq: Three times a day (TID) | ORAL | Status: DC | PRN
  Administered 2023-12-11: 25 mg via ORAL
  Filled 2023-12-09: qty 1

## 2023-12-09 MED ORDER — TRAZODONE HCL 50 MG PO TABS: 50.0000 mg | ORAL_TABLET | Freq: Every evening | ORAL | Status: DC | PRN

## 2023-12-09 MED ORDER — HYDROXYZINE HCL 25 MG PO TABS: 25.0000 mg | ORAL_TABLET | Freq: Three times a day (TID) | ORAL | Status: DC | PRN

## 2023-12-09 MED ORDER — ACETAMINOPHEN 325 MG PO TABS: 650.0000 mg | ORAL_TABLET | Freq: Four times a day (QID) | ORAL | Status: DC | PRN

## 2023-12-09 MED ORDER — AMLODIPINE BESYLATE 10 MG PO TABS
10.0000 mg | ORAL_TABLET | Freq: Every day | ORAL | Status: DC
  Administered 2023-12-10 – 2023-12-14 (×5): 10 mg via ORAL
  Filled 2023-12-09 (×5): qty 1

## 2023-12-09 MED ORDER — ACETAMINOPHEN 325 MG PO TABS
650.0000 mg | ORAL_TABLET | Freq: Four times a day (QID) | ORAL | Status: DC | PRN
  Administered 2023-12-10 – 2023-12-12 (×3): 650 mg via ORAL
  Filled 2023-12-09 (×4): qty 2
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
## 2023-12-09 NOTE — Group Note (Signed)
 Group Topic: Communication  Group Date: 12/09/2023 Start Time: 1200 End Time: 1230 Facilitators: Reinhold Minor BROCKS, RN  Department: [facility]  Number of Participants: 9  Group Focus: communication Treatment Modality:  Psychoeducation Interventions utilized were clarification, patient education, and support Purpose: increase insight and reinforce self-care  Name: Patrick Solomon Date of Birth: [birth year]  MR: [id number]    Level of Participation: pt not on unit at this time Patients Problems:  Patient Active Problem List   Diagnosis Date Noted   Cocaine use disorder (HCC) 12/09/2023   Cocaine abuse (HCC) 06/17/2019   Substance induced mood disorder (HCC) 06/17/2019

## 2023-12-09 NOTE — ED Notes (Signed)
 Labs sent one yellow tube, one red tube, one dark green tube, one light green tube, two lavender tubes, with patient's name on labels and Patient's Requisition Orders with it.

## 2023-12-09 NOTE — ED Notes (Signed)
 The patient is oriented to person, place, and time. Vital signs were taken and within normal limits. Risk assessment was completed, revealing low risk for self-harm or harm to others based on suicidal plan, intent, means, or protective factors. Safety precautions have been implemented as appropriate. Denies SI/HI/AVH. Pt oriented to unit. Food offered. Poc ongoing

## 2023-12-09 NOTE — ED Provider Notes (Signed)
 Facility Based Crisis Admission H&P  Date: 12/09/23 Patient Name: Patrick Solomon MRN: [id number] Chief Complaint: Cocaine use  Diagnoses:  Final diagnoses:  Cocaine use disorder [facility])    HPI: Patrick Solomon is a 53 year old male who presents voluntarily to [facility] behavioral health for walk-in assessment. Patient is assessed by this nurse practitioner face-to-face.  Patient is alert and oriented, pleasant and cooperative during assessment.  Patient presents with euthymic mood, congruent affect.  Patient states I am here because I want to stop smoking crack. Patient reports crack cocaine use for more than 10 years.  Most recently using cocaine approximately once every 14 days.  Most recent cocaine use 3 days ago.  Patrick Solomon released from prison approx. 1 year ago after 2-year stay related to substance use.  He completed an 38-month residential substance use program upon discharge from incarceration.  Patient is insightful today would like residential substance use treatment.  Patient states I have been making stupid decisions.  Patient quit his job last Friday because I thought I could get away from the drugs and have a better opportunity.  Patient denies suicidal and homicidal ideation.  He contracts verbally for safety.  Patient endorses 1 previous suicide attempt approximately 10 years ago when he ingested an intentional overdose.  Patient denies auditory and visual hallucinations.  There is no evidence of delusional thought content no indication the patient is responding to internal stimuli.  Patrick Solomon  diagnosed with major depressive disorder 10 years ago.  He is not linked with outpatient psychiatry.  No current medications to address mood.  1 previous inpatient psychiatric hospitalization approx. 10 years ago after suicide attempt.  Patient denies family mental health history.  Patient resides in [place] with his girlfriend.  He is recently unemployed.  He endorses  average sleep and appetite.  Patient denies alcohol use, denies substance use aside from cocaine.  Patient does use tobacco/nicotine cigarettes, declines nicotine patch.  Patient offered support and encouragement.  Reviewed treatment plan and medications including trazodone and hydroxyzine.  Patient offered opportunity to ask questions, verbalizes understanding and agreement with plan.  PHQ 2-9:  Flowsheet Row Office Visit from 01/14/2023 in [facility] Office Visit from 08/09/2020 in [facility] Office Visit from 05/25/2019 in [facility]  Thoughts that you would be better off dead, or of hurting yourself in some way Not at all More than half the days Not at all  PHQ-9 Total Score 10 14 20     Flowsheet Row ED from 01/05/2021 in [facility] Emergency Department at [facility] Office Visit from 08/09/2020 in [facility] ED from 06/16/2019 in [facility] Emergency Department at [facility]  C-SSRS RISK CATEGORY No Risk Error: Question 1 not populated High Risk      Total Time spent with patient: 30 minutes  Musculoskeletal  Strength & Muscle Tone: within normal limits Gait & Station: normal Patient leans: N/A  Psychiatric Specialty Exam  Presentation General Appearance:  Appropriate for Environment; Casual  Eye Contact: Good  Speech: Clear and Coherent; Normal Rate  Speech Volume: Normal  Handedness: Right   Mood and Affect  Mood: Euthymic  Affect: Appropriate; Congruent   Thought Process  Thought Processes: Coherent; Goal Directed; Linear  Descriptions of Associations:Intact  Orientation:Full (Time, Place and Person)  Thought Content:Logical; WDL    Hallucinations:Hallucinations: None  Ideas of Reference:None  Suicidal Thoughts:Suicidal Thoughts: No  Homicidal Thoughts:Homicidal Thoughts: No   Sensorium  Memory: Immediate Good; Recent  Good  Judgment: Good  Insight: Good   Executive Functions  Concentration: Good  Attention Span: Good  Recall: Good  Fund of Knowledge: Good  Language: Good   Psychomotor Activity  Psychomotor Activity: Psychomotor Activity: Normal   Assets  Assets: Communication Skills; Desire for Improvement; Financial Resources/Insurance; Housing; Physical Health; Resilience; Social Support   Sleep  Sleep: Sleep: Good   Nutritional Assessment (For OBS and FBC admissions only) Has the patient had a weight loss or gain of 10 pounds or more in the last 3 months?: No Has the patient had a decrease in food intake/or appetite?: No Does the patient have dental problems?: No Does the patient have eating habits or behaviors that may be indicators of an eating disorder including binging or inducing vomiting?: No Has the patient recently lost weight without trying?: 0    Physical Exam Vitals and nursing note reviewed.  Constitutional:      Appearance: Normal appearance. He is well-developed.  HENT:     Head: Normocephalic and atraumatic.     Nose: Nose normal.  Cardiovascular:     Rate and Rhythm: Normal rate and regular rhythm.  Pulmonary:     Effort: Pulmonary effort is normal.     Breath sounds: Normal breath sounds.  Musculoskeletal:        General: Normal range of motion.     Cervical back: Normal range of motion.  Skin:    General: Skin is warm and dry.  Neurological:     Mental Status: He is alert and oriented to person, place, and time.  Psychiatric:        Attention and Perception: Attention and perception normal.        Mood and Affect: Mood and affect normal.        Speech: Speech normal.        Behavior: Behavior normal. Behavior is cooperative.        Thought Content: Thought content normal.        Cognition and Memory: Cognition and memory normal.        Judgment: Judgment normal.    Review of Systems  Constitutional: Negative.   HENT: Negative.     Eyes: Negative.   Respiratory: Negative.    Cardiovascular: Negative.   Gastrointestinal: Negative.   Genitourinary: Negative.   Musculoskeletal:  Positive for back pain.  Skin: Negative.   Neurological: Negative.   Psychiatric/Behavioral:  Positive for substance abuse.     Blood pressure 127/89, pulse 88, temperature 98.3 F (36.8 C), temperature source Oral, resp. rate 18, SpO2 100%. There is no height or weight on file to calculate BMI.  Past Psychiatric History: MDD, suicide attempt, cocaine use disorder  Is the patient at risk to self? No  Has the patient been a risk to self in the past 6 months? No .    Has the patient been a risk to self within the distant past? Yes   Is the patient a risk to others? No   Has the patient been a risk to others in the past 6 months? No   Has the patient been a risk to others within the distant past? No   Past Medical History: sciatica  Family History: None reported Social History: Resides in [place] with girlfriend, recently employed in product/process development scientist, chronic cocaine use  Last Labs:  No visits with results within 6 Month(s) from this visit.  Latest known visit with results is:  Procedure visit on  04/29/2023  Component Date Value Ref Range Status   SURGICAL PATHOLOGY 04/29/2023    Final-Edited                   Value:SURGICAL PATHOLOGY [facility] [address], [address] [place], [place] [address] Telephone [phone] or [phone] Fax [phone]  REPORT OF SURGICAL PATHOLOGY   Accession #: [id number] Patient Name: Merriott, Byren Visit # : [id number]  MRN: [id number] Physician: Federico Rosario Stagger DOB/Age 22-Nov-1970 (Age: 84) Gender: M Collected Date: 04/29/2023 Received Date: 05/01/2023  FINAL DIAGNOSIS       1. Surgical [P], colon, ascending, polyp (1) :       -  COLONIC MUCOSA WITH MILD HYPERPLASTIC/SERRATED CHANGE (DEFINITIVE CRYPT      DILATION/BRANCHING FOR A DIAGNOSIS OF  SESSILE SERRATED LESION).       DATE SIGNED OUT: 05/02/2023 ELECTRONIC SIGNATURE : Legolvan Do, Mark, Pathologist, Electronic Signature  MICROSCOPIC DESCRIPTION  CASE COMMENTS STAINS USED IN DIAGNOSIS: H&E    CLINICAL HISTORY  SPECIMEN(S) OBTAINED 1. Surgical [P], Colon, Ascending, Polyp (1)  SPECIMEN COMMENTS: 1. Special screening for malignant neo                         plasms, colon; benign neoplasm of ascending colon SPECIMEN CLINICAL INFORMATION: 1. R/O adenoma    Gross Description 1. Received in formalin are tan, soft tissue fragments that are submitted in toto. Number: 1 Size: 0.5 cm, (1B) ( TA )        Report signed out from the following location(s) [place] [address] [place], [place] [address] CLIA #: [id number]  [facility] [address] [place], [place] [address] CLIA #: [id number]     Allergies: Food  Medications:  Facility Ordered Medications  Medication   amLODipine  (NORVASC ) tablet 10 mg   acetaminophen  (TYLENOL ) tablet 650 mg   alum & mag hydroxide-simeth (MAALOX/MYLANTA) 200-200-20 MG/5ML suspension 30 mL   magnesium hydroxide (MILK OF MAGNESIA) suspension 30 mL   haloperidol (HALDOL) tablet 5 mg   And   diphenhydrAMINE (BENADRYL) capsule 50 mg   haloperidol lactate (HALDOL) injection 5 mg   And   diphenhydrAMINE (BENADRYL) injection 50 mg   And   LORazepam (ATIVAN) injection 2 mg   haloperidol lactate (HALDOL) injection 10 mg   And   diphenhydrAMINE (BENADRYL) injection 50 mg   And   LORazepam (ATIVAN) injection 2 mg   hydrOXYzine (ATARAX) tablet 25 mg   traZODone (DESYREL) tablet 50 mg   PTA Medications  Medication Sig   albuterol  (VENTOLIN  HFA) 108 (90 Base) MCG/ACT inhaler Inhale 2 puffs into the lungs every 6 (six) hours as needed for wheezing or shortness of breath. (Patient not taking: Reported on 04/29/2023)   amLODipine  (NORVASC ) 10 MG tablet Take 1 tablet (10 mg total)  by mouth daily.    Long Term Goals: Improvement in symptoms so as ready for discharge  Short Term Goals: Patient will verbalize feelings in meetings with treatment team members., Patient will attend at least of 50% of the groups daily., Pt will complete the PHQ9 on admission, day 3 and discharge., Patient will participate in completing the [place] Suicide Severity Rating Scale, Patient will score a low risk of violence for 24 hours prior to discharge, and Patient will take medications as prescribed daily.  Medical Decision Making  Patient remains voluntary.  Patient will be admitted to [facility] behavioral  health continuous observation while awaiting facility based crisis study.  Patient would like residential substance use treatment.  Laboratory studies ordered including CBC, CMP, ethanol, magnesium and TSH.  Urine drug screen order initiated.  EKG ordered.  Restart: -amlodipine  10 mg daily   Current medications: -Acetaminophen  650 mg every 6 as needed/mild pain -Maalox 30 mL oral every 4 as needed/digestion -Hydroxyzine 25 mg 3 times daily as needed/anxiety -Magnesium hydroxide 30 mL daily as needed/mild constipation -Trazodone 50 mg nightly as needed/sleep   Agitation protocol: MILD -Haloperidol 5 mg 3 times daily as needed mild agitation  -Diphenhydramine 50 mg p.o. 3 times daily as needed mild agitation  MODERATE -Haloperidol 5 mg IM 3 times daily as needed/moderate agitation -Diphenhydramine 50 mg IM 3 times daily as needed/moderate agitation -Lorazepam 2 mg IM 3 times daily as needed/moderate agitation  SEVERE -Haloperidol 10 mg IM 3 times daily as needed severe agitation -Diphenhydramine 50 mg IM 3 times daily as needed/severe agitation -Lorazepam 2 mg IM 3 times daily as needed/severe agitation     Recommendations  Based on my evaluation the patient does not appear to have an emergency medical condition.  Ellouise LITTIE Dawn, FNP 12/09/23  1:14 PM

## 2023-12-09 NOTE — ED Notes (Signed)
 Patient is in the dayroom calm and composed, watching TV with other patients, and eating snacks. NAD. Respirations even and unlabored. Environment secured per policy. Will monitor for safety

## 2023-12-09 NOTE — Group Note (Signed)
 Group Topic: Communication  Group Date: 12/09/2023 Start Time: 2030 End Time: 2100 Facilitators: Anice Benton LABOR, NT  Department: [facility]  Number of Participants: 6  Group Focus: art therapy, communication Treatment Modality:  Art Therapy Interventions utilized were group exercise Purpose: pt got to guess drawings   Name: Patrick Solomon Date of Birth: 1970-12-23  MR: [id number]    Level of Participation: Did Not Attend  Quality of Participation: N/A Interactions with others: N/A Mood/Affect: N/A Triggers (if applicable): N/A Cognition: N/A Progress: None Response: N/A Plan: patient will be encouraged to attend groups   Patients Problems:  Patient Active Problem List   Diagnosis Date Noted   Cocaine use disorder (HCC) 12/09/2023   Cocaine abuse (HCC) 06/17/2019   Substance induced mood disorder (HCC) 06/17/2019

## 2023-12-10 DIAGNOSIS — F1410 Cocaine abuse, uncomplicated: Secondary | ICD-10-CM | POA: Diagnosis not present

## 2023-12-10 DIAGNOSIS — M549 Dorsalgia, unspecified: Secondary | ICD-10-CM | POA: Diagnosis not present

## 2023-12-10 MED ORDER — NAPROXEN 500 MG PO TABS
500.0000 mg | ORAL_TABLET | Freq: Two times a day (BID) | ORAL | Status: DC | PRN
  Administered 2023-12-10 (×2): 500 mg via ORAL
  Filled 2023-12-10 (×2): qty 1

## 2023-12-10 NOTE — ED Provider Notes (Signed)
 Behavioral Health Progress Note  Date and Time: 12/10/2023 12:43 PM Name: Patrick Solomon MRN:  [id number] Diagnosis:  Final diagnoses:  Cocaine use disorder (HCC)  Cocaine abuse with cocaine-induced mood disorder [facility])   HPI: Patrick Solomon is a 53 y.o. male with a history of MDD & cocaine use disorder who presented to the [facility] on 12/09/2023 requesting assistance with quitting cocaine abuse; Pt stated to admissions provider: I am here because I want to stop smoking crack. Pt was [facility] for treatment and stabilization with an intent of being referred to a longer term substance abuse treatment program.  24 Hour chart review: BP over the past 24 hrs has been WNL. Pt reports that sleep last night was on and off because of the setting, but declines sleep aides. Pt shares that he is attending unit group sessions. He is taking all of his scheduled medications and has not required any agitation protocol medications or antianxiety medications in the last 24 hrs. No behavioral issues reported by nursing.  Today's patient assessment note: On assessment today, the pt reports that their mood is much better today. Reports that anxiety is also improved today than it was yesterday. Sleep is as noted above. Appetite is fair. Concentration is good.  Energy level is average as per patient. Denies suicidal thoughts. Denies suicidal intent and plan.  Denies having any HI.  Denies having psychotic symptoms. Specifically denies AVH, denies paranoia and denies delusional thinking. There are no overt signs of psychosis. Denies having side effects to current psychiatric medications.  We discussed changes to current medication regimen, including adding Naproxen  500 mg BID PRN for back pain. We talked about keeping the other medications the same as listed below. We discussed managing depressive symptoms with Wellbutrin which will also help with cocaine  use d/o, but patient is choosing at this time to pause on starting this medication.  Results Review: Ordered lipid panel, Ha1c, Vit D, B12 (baseline labs in the event that an antidepressant is needing to be started for management of MDD & to ascertain if patient has a nutritional deficiency that might be causing depressive symptom).  Total Time spent with patient: 45 minutes  Past Psychiatric History: MDD Past Medical History: htn Family History: denies Family Psychiatric  History: Denies Social History: Denies having a job currently    Sleep: Fair  Appetite:  Fair  Current Medications:  Current Facility-Administered Medications  Medication Dose Route Frequency Provider Last Rate Last Admin   acetaminophen  (TYLENOL ) tablet 650 mg  650 mg Oral Q6H PRN Dasie Ellouise CROME, FNP   650 mg at 12/10/23 0912   alum & mag hydroxide-simeth (MAALOX/MYLANTA) 200-200-20 MG/5ML suspension 30 mL  30 mL Oral Q4H PRN Dasie Ellouise CROME, FNP       amLODipine  (NORVASC ) tablet 10 mg  10 mg Oral Daily Dasie Ellouise CROME, FNP   10 mg at 12/10/23 0910   haloperidol (HALDOL) tablet 5 mg  5 mg Oral TID PRN Dasie Ellouise CROME, FNP       And   diphenhydrAMINE (BENADRYL) capsule 50 mg  50 mg Oral TID PRN Allen, Tina L, FNP       haloperidol lactate (HALDOL) injection 5 mg  5 mg Intramuscular TID PRN Dasie Ellouise CROME, FNP       And   diphenhydrAMINE (BENADRYL) injection 50 mg  50 mg Intramuscular TID PRN Dasie Ellouise CROME, FNP       And  LORazepam (ATIVAN) injection 2 mg  2 mg Intramuscular TID PRN Allen, Tina L, FNP       haloperidol lactate (HALDOL) injection 10 mg  10 mg Intramuscular TID PRN Allen, Tina L, FNP       And   diphenhydrAMINE (BENADRYL) injection 50 mg  50 mg Intramuscular TID PRN Allen, Tina L, FNP       And   LORazepam (ATIVAN) injection 2 mg  2 mg Intramuscular TID PRN Allen, Tina L, FNP       hydrOXYzine (ATARAX) tablet 25 mg  25 mg Oral TID PRN Allen, Tina L, FNP       magnesium hydroxide (MILK OF MAGNESIA)  suspension 30 mL  30 mL Oral Daily PRN Allen, Tina L, FNP       naproxen  (NAPROSYN ) tablet 500 mg  500 mg Oral BID PRN Tex Drilling, NP       traZODone (DESYREL) tablet 50 mg  50 mg Oral QHS PRN Dasie Ellouise CROME, FNP       Current Outpatient Medications  Medication Sig Dispense Refill   albuterol  (VENTOLIN  HFA) 108 (90 Base) MCG/ACT inhaler Inhale 2 puffs into the lungs every 6 (six) hours as needed for wheezing or shortness of breath. (Patient not taking: Reported on 04/15/2023) 8 g 2   amLODipine  (NORVASC ) 10 MG tablet Take 1 tablet (10 mg total) by mouth daily. (Patient not taking: Reported on 12/09/2023) 90 tablet 1    Labs  Lab Results:  Admission on 12/09/2023, Discharged on 12/09/2023  Component Date Value Ref Range Status   WBC 12/09/2023 5.3  4.0 - 10.5 K/uL Final   RBC 12/09/2023 5.25  4.22 - 5.81 MIL/uL Final   Hemoglobin 12/09/2023 15.0  13.0 - 17.0 g/dL Final   HCT 88/96/7974 45.8  39.0 - 52.0 % Final   MCV 12/09/2023 87.2  80.0 - 100.0 fL Final   MCH 12/09/2023 28.6  26.0 - 34.0 pg Final   MCHC 12/09/2023 32.8  30.0 - 36.0 g/dL Final   RDW 88/96/7974 13.2  11.5 - 15.5 % Final   Platelets 12/09/2023 292  150 - 400 K/uL Final   nRBC 12/09/2023 0.0  0.0 - 0.2 % Final   Neutrophils Relative % 12/09/2023 43  % Final   Neutro Abs 12/09/2023 2.3  1.7 - 7.7 K/uL Final   Lymphocytes Relative 12/09/2023 34  % Final   Lymphs Abs 12/09/2023 1.8  0.7 - 4.0 K/uL Final   Monocytes Relative 12/09/2023 22  % Final   Monocytes Absolute 12/09/2023 1.2 (H)  0.1 - 1.0 K/uL Final   Eosinophils Relative 12/09/2023 0  % Final   Eosinophils Absolute 12/09/2023 0.0  0.0 - 0.5 K/uL Final   Basophils Relative 12/09/2023 1  % Final   Basophils Absolute 12/09/2023 0.0  0.0 - 0.1 K/uL Final   Immature Granulocytes 12/09/2023 0  % Final   Abs Immature Granulocytes 12/09/2023 0.02  0.00 - 0.07 K/uL Final   Performed at [facility], [address], [place], [place] [address]   Sodium 12/09/2023  138  135 - 145 mmol/L Final   Potassium 12/09/2023 4.8  3.5 - 5.1 mmol/L Final   Chloride 12/09/2023 102  98 - 111 mmol/L Final   CO2 12/09/2023 24  22 - 32 mmol/L Final   Glucose, Bld 12/09/2023 77  70 - 99 mg/dL Final   Glucose reference range applies only to samples taken after fasting for at least 8 hours.   BUN  12/09/2023 12  6 - 20 mg/dL Final   Creatinine, Ser 12/09/2023 1.07  0.61 - 1.24 mg/dL Final   Calcium 88/96/7974 9.2  8.9 - 10.3 mg/dL Final   Total Protein 88/96/7974 7.7  6.5 - 8.1 g/dL Final   Albumin 88/96/7974 4.1  3.5 - 5.0 g/dL Final   AST 88/96/7974 28  15 - 41 U/L Final   ALT 12/09/2023 19  0 - 44 U/L Final   Alkaline Phosphatase 12/09/2023 50  38 - 126 U/L Final   Total Bilirubin 12/09/2023 0.3  0.0 - 1.2 mg/dL Final   GFR, Estimated 12/09/2023 >60  >60 mL/min Final   Comment: (NOTE) Calculated using the CKD-EPI Creatinine Equation (2021)    Anion gap 12/09/2023 12  5 - 15 Final   Performed at [facility], [address], [place], [place] [address]   Alcohol, Ethyl (B) 12/09/2023 <15  <15 mg/dL Final   Comment: (NOTE) For medical purposes only. Performed at [facility], [address] [place], [place] [address]    Magnesium 12/09/2023 2.1  1.7 - 2.4 mg/dL Final   Performed at [facility], [address] [place], [place] [address]   TSH 12/09/2023 1.964  0.350 - 4.500 uIU/mL Final   Comment: Performed by a 3rd Generation assay with a functional sensitivity of <=0.01 uIU/mL. Performed at [facility], [address], [place], [place] [address]    POC Amphetamine UR 12/09/2023 None Detected  NONE DETECTED (Cut Off Level 1000 ng/mL) Final   POC Secobarbital (BAR) 12/09/2023 None Detected  NONE DETECTED (Cut Off Level 300 ng/mL) Final   POC Buprenorphine (BUP) 12/09/2023 None Detected  NONE DETECTED (Cut Off Level 10 ng/mL) Final   POC Oxazepam (BZO) 12/09/2023 None Detected  NONE DETECTED (Cut Off Level 300 ng/mL) Final   POC  Cocaine UR 12/09/2023 None Detected  NONE DETECTED (Cut Off Level 300 ng/mL) Final   POC Methamphetamine UR 12/09/2023 None Detected  NONE DETECTED (Cut Off Level 1000 ng/mL) Final   POC Morphine  12/09/2023 None Detected  NONE DETECTED (Cut Off Level 300 ng/mL) Final   POC Methadone UR 12/09/2023 None Detected  NONE DETECTED (Cut Off Level 300 ng/mL) Final   POC Oxycodone  UR 12/09/2023 None Detected  NONE DETECTED (Cut Off Level 100 ng/mL) Final   POC Marijuana UR 12/09/2023 Positive (A)  NONE DETECTED (Cut Off Level 50 ng/mL) Final    Blood Alcohol level:  Lab Results  Component Value Date   ETH <15 12/09/2023   ETH 201 (H) 06/16/2019    Metabolic Disorder Labs: No results found for: HGBA1C, MPG No results found for: PROLACTIN Lab Results  Component Value Date   CHOL 164 04/13/2019   TRIG 67 04/13/2019   HDL 79 04/13/2019   CHOLHDL 2.1 04/13/2019   LDLCALC 72 04/13/2019    Therapeutic Lab Levels: No results found for: LITHIUM No results found for: VALPROATE No results found for: CBMZ  Physical Findings   GAD-7    Flowsheet Row Office Visit from 01/14/2023 in [facility] Office Visit from 08/09/2020 in [facility] Office Visit from 05/25/2019 in [facility] Office Visit from 04/13/2019 in [facility]  Total GAD-7 Score 0 14 16 16    PHQ2-9    Flowsheet Row ED from 12/09/2023 in [facility] Most recent reading at 12/09/2023  3:57 PM ED from 12/09/2023 in [facility] Most recent reading at 12/09/2023  1:21 PM Office Visit from 02/12/2023 in [facility] Most recent reading at 02/12/2023  3:30 PM Office Visit from 01/14/2023 in [facility] Most recent reading at 01/14/2023  2:37 PM Office Visit from 08/09/2020 in [facility] Most recent reading at 08/09/2020  4:01 PM   PHQ-2 Total Score 3 2 0 0 2  PHQ-9 Total Score 11 10 -- 10 14   Flowsheet Row ED from 12/09/2023 in [facility] ED from 01/05/2021 in [facility] Office Visit from 08/09/2020 in [facility]  C-SSRS RISK CATEGORY No Risk No Risk Error: Question 1 not populated     Musculoskeletal  Strength & Muscle Tone: within normal limits Gait & Station: normal Patient leans: N/A  Psychiatric Specialty Exam  Presentation  General Appearance:  Casual  Eye Contact: Fair  Speech: Clear and Coherent  Speech Volume: Normal  Handedness: Right   Mood and Affect  Mood: Depressed  Affect: Congruent   Thought Process  Thought Processes: Coherent  Descriptions of Associations:Intact  Orientation:Full (Time, Place and Person)  Thought Content:Logical     Hallucinations:Hallucinations: None  Ideas of Reference:None  Suicidal Thoughts:Suicidal Thoughts: No  Homicidal Thoughts:Homicidal Thoughts: No   Sensorium  Memory: Immediate Fair  Judgment: Fair  Insight: Fair   Art Therapist  Concentration: Fair  Attention Span: Fair  Recall: [employer] of Knowledge: Fair  Language: Fair   Psychomotor Activity  Psychomotor Activity: Psychomotor Activity: Decreased   Assets  Assets: Communication Skills; Resilience   Sleep  Sleep: Sleep: Fair  Estimated Sleeping Duration (Last 24 Hours): 12.50-15.25 hours (Due to Daylight Saving Time, the durations displayed may not accurately represent documentation during the time change interval)  Nutritional Assessment (For OBS and [facility] admissions only) Has the patient had a weight loss or gain of 10 pounds or more in the last 3 months?: No Has the patient had a decrease in food intake/or appetite?: No Does the patient have dental problems?: No Does the patient have eating habits or behaviors that may be  indicators of an eating disorder including binging or inducing vomiting?: No Has the patient recently lost weight without trying?: 0 Has the patient been eating poorly because of a decreased appetite?: 0 Malnutrition Screening Tool Score: 0    Physical Exam  Physical Exam Constitutional:      Appearance: Normal appearance.  HENT:     Nose: Nose normal.  Eyes:     Pupils: Pupils are equal, round, and reactive to light.  Musculoskeletal:        General: Normal range of motion.     Cervical back: Normal range of motion.  Neurological:     Mental Status: He is alert.  Psychiatric:        Thought Content: Thought content normal.    Review of Systems  Cardiovascular: Negative.   All other systems reviewed and are negative.  Blood pressure 117/84, pulse 91, temperature 98.7 F (37.1 C), temperature source Oral, resp. rate 18, SpO2 98%. There is no height or weight on file to calculate BMI.  Treatment Plan Summary: Daily contact with patient to assess and evaluate symptoms and progress in treatment and Medication management Treatment Plan Summary: Daily contact with patient to assess and evaluate symptoms and progress in treatment and Medication management  Safety and Monitoring: Voluntary admission to inpatient psychiatric unit  for safety, stabilization and treatment Daily contact with patient to assess and evaluate symptoms and progress in treatment Patient's case to be discussed in multi-disciplinary team meeting Observation Level : q15 minute checks Vital signs: q12 hours Precautions: Safety  Long Term Goal(s): Improvement in symptoms so as ready for discharge  Short Term Goals: Ability to identify changes in lifestyle to reduce recurrence of condition will improve, Ability to verbalize feelings will improve, Ability to disclose and discuss suicidal ideas, Ability to demonstrate self-control will improve, Ability to identify and develop effective coping behaviors will  improve, Ability to maintain clinical measurements within normal limits will improve, Compliance with prescribed medications will improve, and Ability to identify triggers associated with substance abuse/mental health issues will improve  Diagnoses Principal Problem:   Cocaine use disorder (HCC)  Medications - Continue Norvasc  10 mg daily for hypertension   PRNS -Start naproxen  500 mg twice daily as needed for back pain -Continue Tylenol  650 mg every 6 hours PRN for mild pain -Continue hydroxyzine 25 mg 3 times daily as needed for anxiety -Continue agitation protocol medications: Ativan/Benadryl/Haldol 3 times daily as needed for agitation -Continue trazodone 50 mg nightly as needed for sleep -Continue Maalox 30 mg every 4 hrs PRN for indigestion -Continue Milk of Magnesia as needed every 6 hrs for constipation  Discharge Planning: Social work and case management to assist with discharge planning and identification of hospital follow-up needs prior to discharge Estimated LOS: 5-7 days Discharge Concerns: Need to establish a safety plan; Medication compliance and effectiveness Discharge Goals: Return home with outpatient referrals for mental health follow-up including medication management/psychotherapy  I certify that inpatient services furnished can reasonably be expected to improve the patient's condition.    Total Time Spent in Direct Patient Care:  I personally spent 45 minutes on the unit in direct patient care. The direct patient care time included face-to-face time with the patient, reviewing the patient's chart, communicating with other professionals, and coordinating care. Greater than 50% of this time was spent in counseling or coordinating care with the patient regarding goals of hospitalization, psycho-education, and discharge planning needs.    Donia Snell, NP 11/4/202512:43 PM    Donia Snell, NP 12/10/2023 12:43 PM

## 2023-12-10 NOTE — Group Note (Signed)
 Group Topic: Social Support  Group Date: 12/10/2023 Start Time: 1945 End Time: 2015 Facilitators: Joan Plowman B  Department: [facility]  Number of Participants: 12  Group Focus: check in Treatment Modality:  Individual Therapy Interventions utilized were leisure development Purpose: express feelings  Name: Patrick Solomon Date of Birth: March 11, 1970  MR: [id number]    Level of Participation: active Quality of Participation: attentive and cooperative Interactions with others: gave feedback Mood/Affect: appropriate Triggers (if applicable): NA Cognition: coherent/clear Progress: Gaining insight Response: NA Plan: patient will be encouraged to keep going to groups  Patients Problems:  Patient Active Problem List   Diagnosis Date Noted   Cocaine use disorder (HCC) 12/09/2023   Cocaine abuse (HCC) 06/17/2019   Substance induced mood disorder (HCC) 06/17/2019

## 2023-12-10 NOTE — ED Notes (Signed)
 Patient is in the bedroom calm and composed, sleeping. NAD. Q15 checks in place, Will monitor for safety.

## 2023-12-10 NOTE — ED Notes (Signed)
 Pt was on the phone when writer arrived on the unit. During assessment, pt denied SI/HI/AVH. Pt informed clinical research associate about his discharge to [facility] in the morning and express excited about it.Q15 safety checks in place.

## 2023-12-10 NOTE — Group Note (Signed)
 Group Topic: Change and Accountability  Group Date: 12/10/2023 Start Time: 1325 End Time: 1357 Facilitators: Veverly Oddis BRAVO, NT  Department: [facility]  Number of Participants: 7  Group Focus: Personal Strengths Treatment Modality:  Cognitive Behavioral Therapy Interventions utilized were assignment Purpose: regain self-worth  Name: Akito Plato Date of Birth: 11-28-1970  MR: [id number]    Level of Participation: active Quality of Participation: engaged Interactions with others: gave feedback Mood/Affect: bright Triggers (if applicable): none Cognition: coherent/clear Progress: Moderate Response: One of my strengths are being positive and having empathy. Plan: patient will be encouraged to keep focusing on great strengths of his and to keep coming to group.  Patients Problems:  Patient Active Problem List   Diagnosis Date Noted   Cocaine use disorder (HCC) 12/09/2023   Cocaine abuse (HCC) 06/17/2019   Substance induced mood disorder (HCC) 06/17/2019

## 2023-12-10 NOTE — ED Notes (Signed)
 Patient is in the bedroom calm and sleeping. NAD. Q15 checks in place, Will monitor for safety.

## 2023-12-10 NOTE — Care Management (Addendum)
 [facility] Care Management...  Writer met with the patient and discussed discharge planning.  Patient requests inpatient substance abuse treatment.    Patient has been referred to [facility]  1:40 pm Per [facility] and [facility], patients insurance would need to switched to [employer].  Patient currently has Medicaid [facility]

## 2023-12-10 NOTE — ED Notes (Signed)
 Pt is in his room resting. No acute distress noted. Q15 safety checks in place per facility policy.

## 2023-12-10 NOTE — ED Notes (Signed)
 Paitent provided breakfast.

## 2023-12-10 NOTE — ED Notes (Signed)
 Pt calm and cooperative. Pt currently speaking on phone.

## 2023-12-10 NOTE — ED Notes (Signed)
 Paitent provided dinner.

## 2023-12-10 NOTE — ED Notes (Signed)
 Pt says he feels 'okay', denies si hi and avh- verbal contract for safety provided. Pt c/o level 8/10 back pain- was administered prn tylenol . Pt ate breakfast, medications reviewed, questions denied.

## 2023-12-10 NOTE — Group Note (Signed)
 Group Topic: Wellness  Group Date: 12/10/2023 Start Time: 1200 End Time: 1230 Facilitators: Daved Tinnie HERO, RN; Leron, Charolette CROME, RN; Herold Lajuana NOVAK, RN  Department: [facility]  Number of Participants: 11  Group Focus: nursing group Treatment Modality:  Psychoeducation Interventions utilized were patient education Purpose: relapse prevention strategies  Name: Patrick Solomon Date of Birth: 1970/06/11  MR: [id number]    Level of Participation: moderate Quality of Participation: cooperative Interactions with others: gave feedback Mood/Affect: appropriate Triggers (if applicable): n/a Cognition: coherent/clear Progress: Gaining insight Response: RN reviewed medications with pt, pt expressed understanding, further questions denied  Plan: patient will be encouraged to attend future RN educations groups, discuss medication questions with RN as needed.   Patients Problems:  Patient Active Problem List   Diagnosis Date Noted   Cocaine use disorder (HCC) 12/09/2023   Cocaine abuse (HCC) 06/17/2019   Substance induced mood disorder (HCC) 06/17/2019

## 2023-12-10 NOTE — ED Notes (Signed)
 Pt generally calm and polite. Pt reports continued back pain level 8/10. Pt administered prn naproxen .

## 2023-12-11 DIAGNOSIS — F1410 Cocaine abuse, uncomplicated: Secondary | ICD-10-CM | POA: Diagnosis not present

## 2023-12-11 DIAGNOSIS — M549 Dorsalgia, unspecified: Secondary | ICD-10-CM | POA: Diagnosis not present

## 2023-12-11 NOTE — Group Note (Signed)
 Group Topic: Social Support  Group Date: 12/11/2023 Start Time: 1000 End Time: 1100 Facilitators: Elnor Keven SAILOR  Department: [facility]  Number of Participants: 9  Group Focus: suicide prevention skills Treatment Modality:  Psychoeducation Interventions utilized were patient education Purpose: enhance coping skills  Name: Halley Kolek Date of Birth: [birth year]  MR: [id number]    Level of Participation: minimal Quality of Participation: attentive Interactions with others: gave feedback Mood/Affect: appropriate Triggers (if applicable): NA Cognition: coherent/clear Progress: Moderate Response:  Pt shared he wants to break his old bad habits. Pt listened to discussion of important of verbiage around mental health and education of loved ones.  Plan: follow-up needed  Patients Problems:  Patient Active Problem List   Diagnosis Date Noted   Cocaine use disorder (HCC) 12/09/2023   Cocaine abuse (HCC) 06/17/2019   Substance induced mood disorder (HCC) 06/17/2019

## 2023-12-11 NOTE — ED Notes (Signed)
 Patient sitting in dayroom participating in nursing group, calm and composed. No acute distress noted. No concerns voiced. No inappropriate behaviors observed or reported at this time. Informed patient to notify staff with any needs or assistance. Patient verbalized understanding or agreement. Safety checks in place per facility policy.

## 2023-12-11 NOTE — Group Note (Signed)
 Group Topic: Relapse and Recovery  Group Date: 12/11/2023 Start Time: 2000 End Time: 2100 Facilitators: Joan Plowman B  Department: [facility]  Number of Participants: 6  Group Focus: daily focus, relapse prevention, and substance abuse education Treatment Modality:  Individual Therapy Interventions utilized were leisure development, patient education, problem solving, and support Purpose: enhance coping skills, express feelings, increase insight, relapse prevention strategies, and trigger / craving management  Name: Patrick Solomon Date of Birth: [birth year]  MR: [id number]    Level of Participation: PT DID NOT ATTEND GROUPS   Patients Problems:  Patient Active Problem List   Diagnosis Date Noted   Cocaine use disorder (HCC) 12/09/2023   Cocaine abuse (HCC) 06/17/2019   Substance induced mood disorder (HCC) 06/17/2019

## 2023-12-11 NOTE — ED Notes (Signed)

## 2023-12-11 NOTE — ED Notes (Signed)
 Patient sitting in bedroom speaking with the provider, calm and composed. No acute distress noted. No concerns voiced. No inappropriate behaviors observed or reported at this time. Informed patient to notify staff with any needs or assistance. Patient verbalized understanding or agreement. Safety checks in place per facility policy.

## 2023-12-11 NOTE — ED Notes (Signed)
 Patient is in the bedroom calm and composed, NAD.  Environment secured per policy. Respirations even and unlabored. Will monitor for safety.

## 2023-12-11 NOTE — Group Note (Signed)
 Group Topic: Emotional Regulation  Group Date: 12/11/2023 Start Time: 1545 End Time: 1630 Facilitators: Ward, Zane HERO, RN; Linnie Thurmond HERO, RN  Department: [facility]  Number of Participants: 6  Group Focus: nursing group, coping skills, and medication education Treatment Modality:  Interpersonal Therapy and Psychoeducation Interventions utilized were clarification, group exercise, and patient education Purpose: enhance coping skills and increase insight  Name: Patrick Solomon Date of Birth: 1970-10-20  MR: [id number]    Level of Participation: active Quality of Participation: attentive, cooperative,  and motivated Interactions with others: gave feedback Mood/Affect: brightens with interaction Triggers (if applicable): pt did not express triggers Cognition: insightful Progress: Moderate Response: Openly shared with group Plan: patient will be encouraged to continue to attend group therapy  Patients Problems:  Patient Active Problem List   Diagnosis Date Noted   Cocaine use disorder (HCC) 12/09/2023   Cocaine abuse (HCC) 06/17/2019   Substance induced mood disorder (HCC) 06/17/2019

## 2023-12-11 NOTE — ED Notes (Signed)
 Patient resting with eyes closed in no apparent acute distress. Respirations even and unlabored. Environment secured. Safety checks in place according to facility policy.

## 2023-12-11 NOTE — ED Provider Notes (Signed)
 Behavioral Health Progress Note  Date and Time: 12/11/2023 1:11 PM Name: Patrick Solomon MRN:  [id number] Diagnosis:  Final diagnoses:  Cocaine use disorder (HCC)  Cocaine abuse with cocaine-induced mood disorder [facility])   HPI: Patrick Solomon is a 53 y.o. male with a history of MDD & cocaine use disorder who presented to the [facility] on 12/09/2023 requesting assistance with quitting cocaine abuse; Pt stated to admissions provider: I am here because I want to stop smoking crack. Pt was was [facility] for treatment and stabilization with an intent of being referred to a longer term substance abuse treatment program.  24-hour chart review: Chart reviewed, case discussed with treatment team.  No behavioral issues noted in the past 24 hours.  Patient is compliant with medications.  Vitals are within normal limits.  Only complaint has been back pain, but patient reports that this is chronic and was pre-existing prior to presentation to this behavioral health center.  Today's patient assessment note: On assessment today, the pt reports that their mood is euthymic, improved since admission, and stable. Denies feeling down, depressed, or sad.  Reports that anxiety symptoms are at manageable level.  Sleep is stable. Appetite is stable.  Concentration is without complaint.  Energy level is adequate. Denies having any suicidal thoughts. Denies having any suicidal intent and plan.  Denies having any HI.  Denies having psychotic symptoms.  Patient specifically denies suicidal ideations, denies homicidal ideations, denies delusional thinking, and there are no overt signs of psychosis. Denies having side effects to current medications.   Discussed discharge planning for over the weekend in the event that he is still unable to secure a spot into a rehab facility. CSW has placed referrals for patient, but there are still no offers for him yet. He is denying all  signs/symptoms of substance related withdrawals at this time.Continuing all meds as ordered. Naproxen  500 mg BID PRN was ordered yesterday for pain and pt states that this medication has been helpful. Pt is not receptive to starting any psychiatric medications at this time.   Results Review: Ordered lipid panel, Ha1c, Vit D, B12 (baseline labs in the event that an antidepressant is needing to be started for management of MDD & to ascertain if patient has a nutritional deficiency that might be causing depressive symptom)-pending.  Total Time spent with patient: 45 minutes  Past Psychiatric History: MDD Past Medical History: htn Family History: denies Family Psychiatric  History: Denies Social History: Denies having a job currently    Sleep: Fair  Appetite:  Fair  Current Medications:  Current Facility-Administered Medications  Medication Dose Route Frequency Provider Last Rate Last Admin   acetaminophen  (TYLENOL ) tablet 650 mg  650 mg Oral Q6H PRN Dasie Ellouise CROME, FNP   650 mg at 12/11/23 0929   alum & mag hydroxide-simeth (MAALOX/MYLANTA) 200-200-20 MG/5ML suspension 30 mL  30 mL Oral Q4H PRN Dasie Ellouise CROME, FNP       amLODipine  (NORVASC ) tablet 10 mg  10 mg Oral Daily Dasie Ellouise CROME, FNP   10 mg at 12/11/23 0927   haloperidol (HALDOL) tablet 5 mg  5 mg Oral TID PRN Dasie Ellouise CROME, FNP       And   diphenhydrAMINE (BENADRYL) capsule 50 mg  50 mg Oral TID PRN Allen, Tina L, FNP       haloperidol lactate (HALDOL) injection 5 mg  5 mg Intramuscular TID PRN Dasie Ellouise CROME, FNP  And   diphenhydrAMINE (BENADRYL) injection 50 mg  50 mg Intramuscular TID PRN Allen, Tina L, FNP       And   LORazepam (ATIVAN) injection 2 mg  2 mg Intramuscular TID PRN Allen, Tina L, FNP       haloperidol lactate (HALDOL) injection 10 mg  10 mg Intramuscular TID PRN Allen, Tina L, FNP       And   diphenhydrAMINE (BENADRYL) injection 50 mg  50 mg Intramuscular TID PRN Allen, Tina L, FNP       And   LORazepam  (ATIVAN) injection 2 mg  2 mg Intramuscular TID PRN Allen, Tina L, FNP       hydrOXYzine (ATARAX) tablet 25 mg  25 mg Oral TID PRN Allen, Tina L, FNP       magnesium hydroxide (MILK OF MAGNESIA) suspension 30 mL  30 mL Oral Daily PRN Allen, Tina L, FNP       naproxen  (NAPROSYN ) tablet 500 mg  500 mg Oral BID PRN Tex Drilling, NP   500 mg at 12/10/23 2132   traZODone (DESYREL) tablet 50 mg  50 mg Oral QHS PRN Dasie Ellouise CROME, FNP       Current Outpatient Medications  Medication Sig Dispense Refill   albuterol  (VENTOLIN  HFA) 108 (90 Base) MCG/ACT inhaler Inhale 2 puffs into the lungs every 6 (six) hours as needed for wheezing or shortness of breath. (Patient not taking: Reported on 04/15/2023) 8 g 2   amLODipine  (NORVASC ) 10 MG tablet Take 1 tablet (10 mg total) by mouth daily. (Patient not taking: Reported on 12/09/2023) 90 tablet 1    Labs  Lab Results:  Admission on 12/09/2023, Discharged on 12/09/2023  Component Date Value Ref Range Status   WBC 12/09/2023 5.3  4.0 - 10.5 K/uL Final   RBC 12/09/2023 5.25  4.22 - 5.81 MIL/uL Final   Hemoglobin 12/09/2023 15.0  13.0 - 17.0 g/dL Final   HCT 88/96/7974 45.8  39.0 - 52.0 % Final   MCV 12/09/2023 87.2  80.0 - 100.0 fL Final   MCH 12/09/2023 28.6  26.0 - 34.0 pg Final   MCHC 12/09/2023 32.8  30.0 - 36.0 g/dL Final   RDW 88/96/7974 13.2  11.5 - 15.5 % Final   Platelets 12/09/2023 292  150 - 400 K/uL Final   nRBC 12/09/2023 0.0  0.0 - 0.2 % Final   Neutrophils Relative % 12/09/2023 43  % Final   Neutro Abs 12/09/2023 2.3  1.7 - 7.7 K/uL Final   Lymphocytes Relative 12/09/2023 34  % Final   Lymphs Abs 12/09/2023 1.8  0.7 - 4.0 K/uL Final   Monocytes Relative 12/09/2023 22  % Final   Monocytes Absolute 12/09/2023 1.2 (H)  0.1 - 1.0 K/uL Final   Eosinophils Relative 12/09/2023 0  % Final   Eosinophils Absolute 12/09/2023 0.0  0.0 - 0.5 K/uL Final   Basophils Relative 12/09/2023 1  % Final   Basophils Absolute 12/09/2023 0.0  0.0 - 0.1 K/uL  Final   Immature Granulocytes 12/09/2023 0  % Final   Abs Immature Granulocytes 12/09/2023 0.02  0.00 - 0.07 K/uL Final   Performed at [facility], [address] [place], [place] [address]   Sodium 12/09/2023 138  135 - 145 mmol/L Final   Potassium 12/09/2023 4.8  3.5 - 5.1 mmol/L Final   Chloride 12/09/2023 102  98 - 111 mmol/L Final   CO2 12/09/2023 24  22 - 32 mmol/L Final  Glucose, Bld 12/09/2023 77  70 - 99 mg/dL Final   Glucose reference range applies only to samples taken after fasting for at least 8 hours.   BUN 12/09/2023 12  6 - 20 mg/dL Final   Creatinine, Ser 12/09/2023 1.07  0.61 - 1.24 mg/dL Final   Calcium 88/96/7974 9.2  8.9 - 10.3 mg/dL Final   Total Protein 88/96/7974 7.7  6.5 - 8.1 g/dL Final   Albumin 88/96/7974 4.1  3.5 - 5.0 g/dL Final   AST 88/96/7974 28  15 - 41 U/L Final   ALT 12/09/2023 19  0 - 44 U/L Final   Alkaline Phosphatase 12/09/2023 50  38 - 126 U/L Final   Total Bilirubin 12/09/2023 0.3  0.0 - 1.2 mg/dL Final   GFR, Estimated 12/09/2023 >60  >60 mL/min Final   Comment: (NOTE) Calculated using the CKD-EPI Creatinine Equation (2021)    Anion gap 12/09/2023 12  5 - 15 Final   Performed at [facility], [address], [place], [place] [address]   Alcohol, Ethyl (B) 12/09/2023 <15  <15 mg/dL Final   Comment: (NOTE) For medical purposes only. Performed at [facility], [address] [place], [place] [address]    Magnesium 12/09/2023 2.1  1.7 - 2.4 mg/dL Final   Performed at [facility], [address], [place], [place] [address]   TSH 12/09/2023 1.964  0.350 - 4.500 uIU/mL Final   Comment: Performed by a 3rd Generation assay with a functional sensitivity of <=0.01 uIU/mL. Performed at [facility], [address], [place], [place] [address]    POC Amphetamine UR 12/09/2023 None Detected  NONE DETECTED (Cut Off Level 1000 ng/mL) Final   POC Secobarbital (BAR) 12/09/2023 None Detected  NONE DETECTED (Cut Off Level  300 ng/mL) Final   POC Buprenorphine (BUP) 12/09/2023 None Detected  NONE DETECTED (Cut Off Level 10 ng/mL) Final   POC Oxazepam (BZO) 12/09/2023 None Detected  NONE DETECTED (Cut Off Level 300 ng/mL) Final   POC Cocaine UR 12/09/2023 None Detected  NONE DETECTED (Cut Off Level 300 ng/mL) Final   POC Methamphetamine UR 12/09/2023 None Detected  NONE DETECTED (Cut Off Level 1000 ng/mL) Final   POC Morphine  12/09/2023 None Detected  NONE DETECTED (Cut Off Level 300 ng/mL) Final   POC Methadone UR 12/09/2023 None Detected  NONE DETECTED (Cut Off Level 300 ng/mL) Final   POC Oxycodone  UR 12/09/2023 None Detected  NONE DETECTED (Cut Off Level 100 ng/mL) Final   POC Marijuana UR 12/09/2023 Positive (A)  NONE DETECTED (Cut Off Level 50 ng/mL) Final    Blood Alcohol level:  Lab Results  Component Value Date   ETH <15 12/09/2023   ETH 201 (H) 06/16/2019    Metabolic Disorder Labs: No results found for: HGBA1C, MPG No results found for: PROLACTIN Lab Results  Component Value Date   CHOL 164 04/13/2019   TRIG 67 04/13/2019   HDL 79 04/13/2019   CHOLHDL 2.1 04/13/2019   LDLCALC 72 04/13/2019    Therapeutic Lab Levels: No results found for: LITHIUM No results found for: VALPROATE No results found for: CBMZ  Physical Findings   GAD-7    Flowsheet Row Office Visit from 01/14/2023 in [facility] Office Visit from 08/09/2020 in [facility] Office Visit from 05/25/2019 in [facility] Office Visit from 04/13/2019 in [facility]  Total GAD-7 Score 0 14 16 16  PHQ2-9    Flowsheet Row ED from 12/09/2023 in [facility] Most recent reading at 12/09/2023  3:57 PM ED from 12/09/2023 in [facility] Most recent reading at 12/09/2023  1:21 PM Office Visit from 02/12/2023 in [facility] Most recent reading at 02/12/2023   3:30 PM Office Visit from 01/14/2023 in [facility] Most recent reading at 01/14/2023  2:37 PM Office Visit from 08/09/2020 in [facility] Most recent reading at 08/09/2020  4:01 PM  PHQ-2 Total Score 3 2 0 0 2  PHQ-9 Total Score 11 10 -- 10 14   Flowsheet Row ED from 12/09/2023 in [facility] ED from 01/05/2021 in [facility] Emergency Department at [facility] Office Visit from 08/09/2020 in [facility]  C-SSRS RISK CATEGORY No Risk No Risk Error: Question 1 not populated     Musculoskeletal  Strength & Muscle Tone: within normal limits Gait & Station: normal Patient leans: N/A  Psychiatric Specialty Exam  Presentation  General Appearance:  Casual; Fairly Groomed  Eye Contact: Fair  Speech: Clear and Coherent; Normal Rate  Speech Volume: Normal  Handedness: Right   Mood and Affect  Mood: Euthymic  Affect: Appropriate; Congruent   Thought Process  Thought Processes: Coherent  Descriptions of Associations:Intact  Orientation:Full (Time, Place and Person)  Thought Content:Logical     Hallucinations:Hallucinations: None  Ideas of Reference:None  Suicidal Thoughts:Suicidal Thoughts: No  Homicidal Thoughts:Homicidal Thoughts: No   Sensorium  Memory: Immediate Good; Recent Good  Judgment: Good  Insight: Good   Executive Functions  Concentration: Good  Attention Span: Good  Recall: Good  Fund of Knowledge: Good  Language: Good   Psychomotor Activity  Psychomotor Activity: Psychomotor Activity: Normal   Assets  Assets: Communication Skills; Resilience   Sleep  Sleep: Sleep: Good  Estimated Sleeping Duration (Last 24 Hours): 9.50-10.25 hours (Due to Daylight Saving Time, the durations displayed may not accurately represent documentation during the time change interval)  Nutritional Assessment (For OBS and [facility] admissions only) Has  the patient had a weight loss or gain of 10 pounds or more in the last 3 months?: No Has the patient had a decrease in food intake/or appetite?: No Does the patient have dental problems?: No Does the patient have eating habits or behaviors that may be indicators of an eating disorder including binging or inducing vomiting?: No Has the patient recently lost weight without trying?: 0 Has the patient been eating poorly because of a decreased appetite?: 0 Malnutrition Screening Tool Score: 0    Physical Exam  Physical Exam Vitals and nursing note reviewed.  Constitutional:      Appearance: Normal appearance.  HENT:     Nose: Nose normal.  Eyes:     Pupils: Pupils are equal, round, and reactive to light.  Musculoskeletal:        General: Normal range of motion.     Cervical back: Normal range of motion.  Skin:    General: Skin is warm.  Neurological:     General: No focal deficit present.     Mental Status: He is alert and oriented to person, place, and time.  Psychiatric:        Thought Content: Thought content normal.    Review of Systems  Cardiovascular: Negative.   Psychiatric/Behavioral:  Positive for substance abuse and suicidal ideas. Negative for depression, hallucinations and memory loss. The patient is  not nervous/anxious and does not have insomnia.   All other systems reviewed and are negative.  Blood pressure 120/87, pulse 84, temperature 98.4 F (36.9 C), temperature source Oral, resp. rate 16, SpO2 98%. There is no height or weight on file to calculate BMI.  Treatment Plan Summary: Daily contact with patient to assess and evaluate symptoms and progress in treatment and Medication management Treatment Plan Summary: Daily contact with patient to assess and evaluate symptoms and progress in treatment and Medication management  Safety and Monitoring: Voluntary admission to inpatient psychiatric unit for safety, stabilization and treatment Daily contact with patient  to assess and evaluate symptoms and progress in treatment Patient's case to be discussed in multi-disciplinary team meeting Observation Level : q15 minute checks Vital signs: q12 hours Precautions: Safety  Long Term Goal(s): Improvement in symptoms so as ready for discharge  Short Term Goals: Ability to identify changes in lifestyle to reduce recurrence of condition will improve, Ability to verbalize feelings will improve, Ability to disclose and discuss suicidal ideas, Ability to demonstrate self-control will improve, Ability to identify and develop effective coping behaviors will improve, Ability to maintain clinical measurements within normal limits will improve, Compliance with prescribed medications will improve, and Ability to identify triggers associated with substance abuse/mental health issues will improve  Diagnoses Principal Problem:   Cocaine use disorder (HCC)  Medications - Continue Norvasc  10 mg daily for hypertension   PRNS -Continue naproxen  500 mg twice daily as needed for back pain -Continue Tylenol  650 mg every 6 hours PRN for mild pain -Continue hydroxyzine 25 mg 3 times daily as needed for anxiety -Continue agitation protocol medications: Ativan/Benadryl/Haldol 3 times daily as needed for agitation -Continue trazodone 50 mg nightly as needed for sleep -Continue Maalox 30 mg every 4 hrs PRN for indigestion -Continue Milk of Magnesia as needed every 6 hrs for constipation  Discharge Planning: Social work and case management to assist with discharge planning and identification of hospital follow-up needs prior to discharge Estimated LOS: 5-7 days Discharge Concerns: Need to establish a safety plan; Medication compliance and effectiveness Discharge Goals: Return home with outpatient referrals for mental health follow-up including medication management/psychotherapy  I certify that inpatient services furnished can reasonably be expected to improve the patient's  condition.    Total Time Spent in Direct Patient Care:  I personally spent 45 minutes on the unit in direct patient care. The direct patient care time included face-to-face time with the patient, reviewing the patient's chart, communicating with other professionals, and coordinating care. Greater than 50% of this time was spent in counseling or coordinating care with the patient regarding goals of hospitalization, psycho-education, and discharge planning needs.    Donia Snell, NP 11/5/20251:11 PM    Donia Snell, NP 12/11/2023 1:11 PM

## 2023-12-11 NOTE — ED Notes (Signed)
 Patient asleep at the moment. NAD. Will monitor for safety.

## 2023-12-11 NOTE — Care Management (Signed)
 [facility] Care Management...  Writer referred patient to [facility]  Patient under review  Writer spoke with [facility]  Per Olivia, they can accept patient [facility].  Writer faxed referral to [facility]  Patient under review

## 2023-12-11 NOTE — ED Notes (Signed)
 Pt is sleeping at the moment. No acute distress noted. Respirations are even and labored. Q 15 safety checks in place per facility policy.

## 2023-12-12 DIAGNOSIS — F1410 Cocaine abuse, uncomplicated: Secondary | ICD-10-CM | POA: Diagnosis not present

## 2023-12-12 DIAGNOSIS — M549 Dorsalgia, unspecified: Secondary | ICD-10-CM | POA: Diagnosis not present

## 2023-12-12 LAB — VITAMIN D 25 HYDROXY (VIT D DEFICIENCY, FRACTURES): Vit D, 25-Hydroxy: 9.9 ng/mL — ABNORMAL LOW (ref 30–100)

## 2023-12-12 LAB — LIPID PANEL
Cholesterol: 149 mg/dL (ref 0–200)
HDL: 63 mg/dL (ref >40)
LDL Cholesterol: 75 mg/dL (ref 0–99)
Total CHOL/HDL Ratio: 2.4 ratio
Triglycerides: 57 mg/dL (ref <150)
VLDL: 11 mg/dL (ref 0–40)

## 2023-12-12 LAB — VITAMIN B12: Vitamin B-12: 423 pg/mL (ref 180–914)

## 2023-12-12 NOTE — ED Notes (Signed)
 Patient asleep at this time, NAD Will continue to monitor for safety.

## 2023-12-12 NOTE — ED Notes (Signed)
 Patient remains on unit with no acute changes noted at this time. Alert and oriented.   Interacting with peers/staff appropriately.    No current suicidal or homicidal ideation reported. No hallucinations or delusions observed.  Patient is tolerating the milieu. Vitals within normal limits.   Medications administered as ordered; no adverse effects noted.

## 2023-12-12 NOTE — Group Note (Signed)
 Group Topic: Social Support  Group Date: 12/12/2023 Start Time: 1530 End Time: 1600 Facilitators: Gerome Jolly, NT  Department: [facility]  Number of Participants: 4  Group Focus: check in Treatment Modality:  Patient-Centered Therapy Interventions utilized were support Purpose: improve communication skills and relapse prevention strategies  Name: Patrick Solomon Date of Birth: Jun 24, 1970  MR: [id number]    Level of Participation: patient did not attend Quality of Participation: na Interactions with others: na Mood/Affect: na Triggers (if applicable): na Cognition: na Progress: na Response: Patient did not attend group Plan: referral / recommendations. Patient is scheduled for discharge tentatively on 11/8 for inpatient treatment at [facility] on 11/19   Patients Problems:  Patient Active Problem List   Diagnosis Date Noted   Cocaine use disorder (HCC) 12/09/2023   Cocaine abuse (HCC) 06/17/2019   Substance induced mood disorder (HCC) 06/17/2019

## 2023-12-12 NOTE — ED Provider Notes (Signed)
 Behavioral Health Progress Note  Date and Time: 12/12/2023 4:30 PM Name: Lorenz Donley MRN:  [id number]  Subjective:  Mr. Vilardi reports doing OK with things getting better by the day. He slept really good overnight. He denied significant active withdrawal symptoms or cravings. He notes an 'awareness' of substances but no strong urge to use. He is hopeful about residential treatment with [facility] starting 11/19. He anticipates discharge this weekend to return home and will work with family to ensure he maintains outside the controlled environment of a facility. He denies problems with current medications. Primary medication is amlodipine  for hypertension. He does note prior treatment in another setting of Flomax for his BPH. He still has urinary symptoms and he's unsure if he gave the Flomax enough time to work.   Diagnosis:  Final diagnoses:  Cocaine use disorder (HCC)  Cocaine abuse with cocaine-induced mood disorder (HCC)   Total Time spent with patient: I personally spent 30 minutes on the unit in direct patient care. The direct patient care time included face-to-face time with the patient, reviewing the patient's chart, communicating with other professionals, and coordinating care. Greater than 50% of this time was spent in counseling or coordinating care with the patient regarding goals of hospitalization, psycho-education, and discharge planning needs. On my assessment the patient denied SI, HI, AVH, paranoia, ideas of reference, or first rank symptoms. Patient denied drug cravings or active signs of withdrawal. Patient denied medication side-effects. Patient was not deemed to be a danger to self or others and has agreed to discharge plans.   Triage HPI: Nhan Skiver is a 53yo male presenting voluntarily to [facility] for [facility] assessment. Pt stated that he would like help with Substance abuse. Pt reported he has been smoking crack cocaine 1x every two weeks when he gets paid from work. 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 Pt stated that he is dealing with some stress from his children. pt stated he has 10 adult children and they all call him with their problems and he often feels like he has to fix them. Also two of the mothers of his children call him and it caused friction between he and his girlfriend. Pt stated he last used crack cocaine last week on Friday. pt stated he has been using ETOH only on the weekend around 2 40oz beers. pt last used ETOH yesterday around 7pm 55 40oz beer. no reports of prior or current withdrawal symptoms. pt did state he used THC about 2 days ago because he didn't have any crack and he had not used THC in years prior to that. Pt stated has no serious medical issues other than sciatica, he reported no SI/HI/AVH past or present. pt did mention he was released from prison last October and was assessed and had to do a drug class for 8 months but doesn't remember the name of the treatment center. otherwise no history of MH or SA treatment in the past. pt calm, good eye contact, well groomed, dressed casually, cooperative.   Past Psychiatric History: MDD, cocaine use disorder, cannabis use Past Medical History: HTN Family History: denies Family Psychiatric  History: Denies Social History: Denies having a job currently, lives with wife  Sleep: Good  Appetite:  Fair  Current Medications:  Current Facility-Administered Medications  Medication Dose Route Frequency Provider Last Rate Last Admin   acetaminophen  (TYLENOL ) tablet 650 mg  650 mg Oral Q6H PRN Dasie Ellouise CROME, FNP   650  mg at 12/12/23 0924   alum & mag hydroxide-simeth (MAALOX/MYLANTA) 200-200-20 MG/5ML suspension 30 mL  30 mL Oral Q4H PRN Dasie Ellouise CROME, FNP       amLODipine  (NORVASC ) tablet 10 mg  10 mg Oral Daily Allen, Tina L, FNP   10 mg at 12/12/23 0923   haloperidol (HALDOL) tablet 5 mg  5 mg Oral TID PRN Allen, Tina L, FNP       And   diphenhydrAMINE (BENADRYL) capsule  50 mg  50 mg Oral TID PRN Allen, Tina L, FNP       haloperidol lactate (HALDOL) injection 5 mg  5 mg Intramuscular TID PRN Allen, Tina L, FNP       And   diphenhydrAMINE (BENADRYL) injection 50 mg  50 mg Intramuscular TID PRN Allen, Tina L, FNP       And   LORazepam (ATIVAN) injection 2 mg  2 mg Intramuscular TID PRN Allen, Tina L, FNP       haloperidol lactate (HALDOL) injection 10 mg  10 mg Intramuscular TID PRN Allen, Tina L, FNP       And   diphenhydrAMINE (BENADRYL) injection 50 mg  50 mg Intramuscular TID PRN Allen, Tina L, FNP       And   LORazepam (ATIVAN) injection 2 mg  2 mg Intramuscular TID PRN Allen, Tina L, FNP       hydrOXYzine (ATARAX) tablet 25 mg  25 mg Oral TID PRN Allen, Tina L, FNP   25 mg at 12/11/23 2121   magnesium hydroxide (MILK OF MAGNESIA) suspension 30 mL  30 mL Oral Daily PRN Allen, Tina L, FNP       naproxen  (NAPROSYN ) tablet 500 mg  500 mg Oral BID PRN Tex Drilling, NP   500 mg at 12/10/23 2132   traZODone (DESYREL) tablet 50 mg  50 mg Oral QHS PRN Allen, Tina L, FNP   50 mg at 12/11/23 2121   Current Outpatient Medications  Medication Sig Dispense Refill   albuterol  (VENTOLIN  HFA) 108 (90 Base) MCG/ACT inhaler Inhale 2 puffs into the lungs every 6 (six) hours as needed for wheezing or shortness of breath. (Patient not taking: Reported on 04/15/2023) 8 g 2   amLODipine  (NORVASC ) 10 MG tablet Take 1 tablet (10 mg total) by mouth daily. (Patient not taking: Reported on 12/09/2023) 90 tablet 1    Labs  Lab Results:  Admission on 12/09/2023  Component Date Value Ref Range Status   Vit D, 25-Hydroxy 12/12/2023 9.90 (L)  30 - 100 ng/mL Final   Comment: (NOTE) Vitamin D deficiency has been defined by the Institute of Medicine  and an Endocrine Society practice guideline as a level of serum 25-OH  vitamin D less than 20 ng/mL (1,2). The Endocrine Society went on to  further define vitamin D insufficiency as a level between 21 and 29  ng/mL (2).  1. IOM  (Institute of Medicine). 2010. Dietary reference intakes for  calcium and D. [place]: [employer]. 2. Holick MF, Binkley [place], Bischoff-Ferrari HA, et al. Evaluation,  treatment, and prevention of vitamin D deficiency: an Endocrine  Society clinical practice guideline, JCEM. 2011 Jul; 96(7): 1911-30.  Performed at [facility], [address], [place], [place] [address]    Cholesterol 12/12/2023 149  0 - 200 mg/dL Final   Triglycerides 88/93/7974 57  <150 mg/dL Final   HDL 88/93/7974 63  >40 mg/dL Final   Total CHOL/HDL Ratio 12/12/2023 2.4  RATIO Final   VLDL 12/12/2023 11  0 - 40 mg/dL Final   LDL Cholesterol 12/12/2023 75  0 - 99 mg/dL Final   Comment:        Total Cholesterol/HDL:CHD Risk Coronary Heart Disease Risk Table                     Men   Women  1/2 Average Risk   3.4   3.3  Average Risk       5.0   4.4  2 X Average Risk   9.6   7.1  3 X Average Risk  23.4   11.0        Use the calculated Patient Ratio above and the CHD Risk Table to determine the patient's CHD Risk.        ATP III CLASSIFICATION (LDL):  <100     mg/dL   Optimal  899-870  mg/dL   Near or Above                    Optimal  130-159  mg/dL   Borderline  839-810  mg/dL   High  >809     mg/dL   Very High Performed at [facility], [address] [place], [place] [address]    Vitamin B-12 12/12/2023 423  180 - 914 pg/mL Final   Comment: (NOTE) This assay is not validated for testing neonatal or myeloproliferative syndrome specimens for Vitamin B12 levels. Performed at [facility], [address], [place], [place] [address]   Admission on 12/09/2023, Discharged on 12/09/2023  Component Date Value Ref Range Status   WBC 12/09/2023 5.3  4.0 - 10.5 K/uL Final   RBC 12/09/2023 5.25  4.22 - 5.81 MIL/uL Final   Hemoglobin 12/09/2023 15.0  13.0 - 17.0 g/dL Final   HCT 88/96/7974 45.8  39.0 - 52.0 % Final   MCV 12/09/2023 87.2  80.0 - 100.0 fL Final   MCH  12/09/2023 28.6  26.0 - 34.0 pg Final   MCHC 12/09/2023 32.8  30.0 - 36.0 g/dL Final   RDW 88/96/7974 13.2  11.5 - 15.5 % Final   Platelets 12/09/2023 292  150 - 400 K/uL Final   nRBC 12/09/2023 0.0  0.0 - 0.2 % Final   Neutrophils Relative % 12/09/2023 43  % Final   Neutro Abs 12/09/2023 2.3  1.7 - 7.7 K/uL Final   Lymphocytes Relative 12/09/2023 34  % Final   Lymphs Abs 12/09/2023 1.8  0.7 - 4.0 K/uL Final   Monocytes Relative 12/09/2023 22  % Final   Monocytes Absolute 12/09/2023 1.2 (H)  0.1 - 1.0 K/uL Final   Eosinophils Relative 12/09/2023 0  % Final   Eosinophils Absolute 12/09/2023 0.0  0.0 - 0.5 K/uL Final   Basophils Relative 12/09/2023 1  % Final   Basophils Absolute 12/09/2023 0.0  0.0 - 0.1 K/uL Final   Immature Granulocytes 12/09/2023 0  % Final   Abs Immature Granulocytes 12/09/2023 0.02  0.00 - 0.07 K/uL Final   Performed at [facility], [address], [place], [place] [address]   Sodium 12/09/2023 138  135 - 145 mmol/L Final   Potassium 12/09/2023 4.8  3.5 - 5.1 mmol/L Final   Chloride 12/09/2023 102  98 - 111 mmol/L Final   CO2 12/09/2023 24  22 - 32 mmol/L Final   Glucose, Bld 12/09/2023 77  70 - 99 mg/dL Final   Glucose reference range  applies only to samples taken after fasting for at least 8 hours.   BUN 12/09/2023 12  6 - 20 mg/dL Final   Creatinine, Ser 12/09/2023 1.07  0.61 - 1.24 mg/dL Final   Calcium 88/96/7974 9.2  8.9 - 10.3 mg/dL Final   Total Protein 88/96/7974 7.7  6.5 - 8.1 g/dL Final   Albumin 88/96/7974 4.1  3.5 - 5.0 g/dL Final   AST 88/96/7974 28  15 - 41 U/L Final   ALT 12/09/2023 19  0 - 44 U/L Final   Alkaline Phosphatase 12/09/2023 50  38 - 126 U/L Final   Total Bilirubin 12/09/2023 0.3  0.0 - 1.2 mg/dL Final   GFR, Estimated 12/09/2023 >60  >60 mL/min Final   Comment: (NOTE) Calculated using the CKD-EPI Creatinine Equation (2021)    Anion gap 12/09/2023 12  5 - 15 Final   Performed at [facility], [address]  [place], [place] [address]   Alcohol, Ethyl (B) 12/09/2023 <15  <15 mg/dL Final   Comment: (NOTE) For medical purposes only. Performed at [facility], [address], [place], [place] [address]    Magnesium 12/09/2023 2.1  1.7 - 2.4 mg/dL Final   Performed at [facility], [address], [place], [place] [address]   TSH 12/09/2023 1.964  0.350 - 4.500 uIU/mL Final   Comment: Performed by a 3rd Generation assay with a functional sensitivity of <=0.01 uIU/mL. Performed at [facility], [address], [place], [place] [address]    POC Amphetamine UR 12/09/2023 None Detected  NONE DETECTED (Cut Off Level 1000 ng/mL) Final   POC Secobarbital (BAR) 12/09/2023 None Detected  NONE DETECTED (Cut Off Level 300 ng/mL) Final   POC Buprenorphine (BUP) 12/09/2023 None Detected  NONE DETECTED (Cut Off Level 10 ng/mL) Final   POC Oxazepam (BZO) 12/09/2023 None Detected  NONE DETECTED (Cut Off Level 300 ng/mL) Final   POC Cocaine UR 12/09/2023 None Detected  NONE DETECTED (Cut Off Level 300 ng/mL) Final   POC Methamphetamine UR 12/09/2023 None Detected  NONE DETECTED (Cut Off Level 1000 ng/mL) Final   POC Morphine  12/09/2023 None Detected  NONE DETECTED (Cut Off Level 300 ng/mL) Final   POC Methadone UR 12/09/2023 None Detected  NONE DETECTED (Cut Off Level 300 ng/mL) Final   POC Oxycodone  UR 12/09/2023 None Detected  NONE DETECTED (Cut Off Level 100 ng/mL) Final   POC Marijuana UR 12/09/2023 Positive (A)  NONE DETECTED (Cut Off Level 50 ng/mL) Final    Blood Alcohol level:  Lab Results  Component Value Date   ETH <15 12/09/2023   ETH 201 (H) 06/16/2019    Metabolic Disorder Labs: No results found for: HGBA1C, MPG No results found for: PROLACTIN Lab Results  Component Value Date   CHOL 149 12/12/2023   TRIG 57 12/12/2023   HDL 63 12/12/2023   CHOLHDL 2.4 12/12/2023   VLDL 11 12/12/2023   LDLCALC 75 12/12/2023   LDLCALC 72 04/13/2019    Therapeutic Lab Levels: No  results found for: LITHIUM No results found for: VALPROATE No results found for: CBMZ  Physical Findings   GAD-7    Flowsheet Row Office Visit from 01/14/2023 in [facility] Office Visit from 08/09/2020 in [facility] Office Visit from 05/25/2019 in [facility] Office Visit from 04/13/2019 in [facility]  Total GAD-7 Score 0 14 16 16    PHQ2-9  Flowsheet Row ED from 12/09/2023 in [facility] Most recent reading at 12/09/2023  3:57 PM ED from 12/09/2023 in [facility] Most recent reading at 12/09/2023  1:21 PM Office Visit from 02/12/2023 in [facility] Most recent reading at 02/12/2023  3:30 PM Office Visit from 01/14/2023 in [facility] Most recent reading at 01/14/2023  2:37 PM Office Visit from 08/09/2020 in [facility] Most recent reading at 08/09/2020  4:01 PM  PHQ-2 Total Score 3 2 0 0 2  PHQ-9 Total Score 11 10 -- 10 14   Flowsheet Row ED from 12/09/2023 in [facility] ED from 01/05/2021 in [facility] Emergency Department at [facility] Office Visit from 08/09/2020 in [facility]  C-SSRS RISK CATEGORY No Risk No Risk Error: Question 1 not populated     Musculoskeletal  Strength & Muscle Tone: within normal limits Gait & Station: normal Patient leans: N/A  Psychiatric Specialty Exam  Presentation  General Appearance:  Appropriate for Environment; Casual  Eye Contact: Fair  Speech: Clear and Coherent  Speech Volume: Normal  Handedness: Right   Mood and Affect  Mood: Euthymic  Affect: Appropriate; Congruent   Thought Process  Thought Processes: Coherent  Descriptions of Associations:Intact  Orientation:Full (Time, Place and Person)  Thought Content:Logical      Hallucinations:Hallucinations: None  Ideas of Reference:None  Suicidal Thoughts:Suicidal Thoughts: No  Homicidal Thoughts:Homicidal Thoughts: No   Sensorium  Memory: Immediate Good; Recent Fair  Judgment: Good  Insight: Good   Executive Functions  Concentration: Good  Attention Span: Good  Recall: Good  Fund of Knowledge: Good  Language: Good   Psychomotor Activity  Psychomotor Activity: Psychomotor Activity: Normal   Assets  Assets: Communication Skills; Desire for Improvement; Resilience; Intimacy   Sleep  Sleep: Sleep: Good  Estimated Sleeping Duration (Last 24 Hours): 10.00-12.75 hours (Due to Daylight Saving Time, the durations displayed may not accurately represent documentation during the time change interval)  Nutritional Assessment (For OBS and [facility] admissions only) Has the patient had a weight loss or gain of 10 pounds or more in the last 3 months?: No Has the patient had a decrease in food intake/or appetite?: No Does the patient have dental problems?: No Does the patient have eating habits or behaviors that may be indicators of an eating disorder including binging or inducing vomiting?: No Has the patient recently lost weight without trying?: 0 Has the patient been eating poorly because of a decreased appetite?: 0 Malnutrition Screening Tool Score: 0    Physical Exam  Physical Exam ROS Blood pressure 105/72, pulse 75, temperature 98.2 F (36.8 C), temperature source Oral, resp. rate 17, SpO2 97%. There is no height or weight on file to calculate BMI.  Treatment Plan Summary: Medications - Continue Norvasc  10 mg daily for hypertension   Discharge Planning: Social work and case management to assist with discharge planning and identification of hospital follow-up needs prior to discharge. Current plan is for discharge home in 24-48hrs followed by transition to [facility] (residential) substance treatment program on 11/19. Patient may  self-present by 7:45 that morning. Estimated continued LOS: 1-3 days Discharge Concerns: Need to establish a safety plan; Medication compliance and effectiveness Discharge Goals: Return home with subsequent transition to residential treatment program [facility]) on 11/19.   KANDI JAYSON HAHN, MD 12/12/2023 4:30 PM

## 2023-12-12 NOTE — ED Notes (Signed)
 Patient monitored throughout shift with no significant issues/stable presentation.. Vital signs within normal limits.  Patient remained calm/cooperative on the unit. Oriented to person, place, and time.  Denies suicidal or homicidal ideation.  No hallucinations or delusions reported or observed. Eating and fluid intake were adequate No medication issues reported; patient compliant with prescribed regimen. Safety observed

## 2023-12-12 NOTE — Discharge Instructions (Addendum)
  Writer spoke with [facility]   Per Rosaline, patient has been accepted to complete intake process   Patient scheduled to discharge on Saturday 12/14/2023 by 11:00 am   Writer coordinated an appointment for Wednesday 12/25/2023 @ 8:30 am   [facility] - [facility] [address] [place], [place] [address] [phone]   RN will arrange Conservation Officer, Nature met with patient...   Writer advised patient of appointment and helped develop a plan to keep patient safe until his appointment   Writer will provide patient with NA meeting material to assist in developing a positive support system   Activity: Daily physical activity; reduce sedentary behaviors   Diet: Portion-limited diet rich in produce, whole (minimally processed) grains, nuts/seeds, eggs, beans/legumes, seafood, Fairlife milk, fermented food, lean meat. Copious water intake. Limit (ultra)processed foods and sugar-sweetened beverages.    Other: -Follow-up with your outpatient psychiatric provider -instructions on appointment date, time, and address (location) are provided to you in discharge paperwork. [facility] 12/25/23   -Take your psychiatric medications as prescribed at discharge - instructions are provided to you in the discharge paperwork   -Follow-up with outpatient primary care doctor to optimize health maintenance/preventative care and other specialists -for management of chronic medical disease. Vitamin D deficiency - take 5000 units every day, get more sun, eat more foods that are good sources such as dairy (if tolerated) try Fairlife, leafy greens, sardines.   -Testing: Follow-up with outpatient provider for abnormal lab results: vitamin D, creatinine  -Recommend total abstinence from alcohol, tobacco, and other illicit drug use at discharge.    -If your psychiatric symptoms recur, worsen, or if you have side effects to your psychiatric medications, call your  outpatient psychiatric provider, [phone] or go to the nearest emergency department.   -If suicidal thoughts occur, immediately call your outpatient psychiatric provider, 911, [phone] or go to the nearest emergency department.

## 2023-12-12 NOTE — Group Note (Signed)
 Group Topic: Wellness  Group Date: 12/12/2023 Start Time: 1200 End Time: 1234 Facilitators: Reinhold Minor BROCKS, RN  Department: [facility]  Number of Participants: 9  Group Focus: nursing group Treatment Modality:  Psychoeducation Interventions utilized were patient education Purpose: reinforce self-care  Name: Patrick Solomon Date of Birth: 1970/05/19  MR: [id number]   Level of Participation: active Quality of Participation: attentive and cooperative Interactions with others: gave feedback Mood/Affect: appropriate and positive Triggers (if applicable): na Cognition: goal directed Progress: Minimal Response: will take emotional wellness serious Plan: patient will be encouraged to focus on physical and emotional wellness  Patients Problems:  Patient Active Problem List   Diagnosis Date Noted   Cocaine use disorder (HCC) 12/09/2023   Cocaine abuse (HCC) 06/17/2019   Substance induced mood disorder (HCC) 06/17/2019

## 2023-12-12 NOTE — Care Management (Signed)
 [facility] Care Management...   Writer spoke with [facility]  Per Rosaline, patient has been accepted to complete intake process  Patient scheduled to discharge on Saturday 12/14/2023 by 11:00 am  Writer coordinated an appointment for Wednesday 12/25/2023 @ 8:30 am  [facility] - [facility] [address] [place], [place] [address] [phone]  RN will arrange Engineer, Mining met with patient...  Writer advised patient of appointment and helped develop a plan to keep patient safe until his appointment  Writer will provide patient with AA/NA meeting material to assist in developing a positive support system

## 2023-12-12 NOTE — ED Notes (Signed)
 Pt is sleeping at this moment. No acute distress noted.Q15 safety checks in place.

## 2023-12-12 NOTE — Group Note (Signed)
 Group Topic: Social Support  Group Date: 12/12/2023 Start Time: 2000 End Time: 2030 Facilitators: Joan Plowman B  Department: [facility]  Number of Participants: 8  Group Focus: abuse issues, anxiety, check in, concentration, coping skills, daily focus, depression, feeling awareness/expression, goals/reality orientation, individual meeting, problem solving, self-esteem, and social skills Treatment Modality:  Individual Therapy, Leisure Development, and Solution-Focused Therapy Interventions utilized were leisure development, patient education, problem solving, and support Purpose: enhance coping skills, express feelings, increase insight, regain self-worth, reinforce self-care, relapse prevention strategies, and trigger / craving management  Name: Patrick Solomon Date of Birth: November 07, 1970  MR: [id number]    Level of Participation: active Quality of Participation: attentive and cooperative Interactions with others: gave feedback Mood/Affect: appropriate Triggers (if applicable): NA Cognition: coherent/clear Progress: Gaining insight Response: NA Plan: patient will be encouraged to keep going to groups  Patients Problems:  Patient Active Problem List   Diagnosis Date Noted   Cocaine use disorder (HCC) 12/09/2023   Cocaine abuse (HCC) 06/17/2019   Substance induced mood disorder (HCC) 06/17/2019

## 2023-12-12 NOTE — ED Notes (Signed)
 Patient alert, Mood appears calm. Respirations even and unlabored. No acute distress observed.  Resting in recliner asleep. Environment secured. Poc ongoing

## 2023-12-12 NOTE — Group Note (Signed)
 Group Topic: Fears and Unhealthy Coping Skills  Group Date: 12/12/2023 Start Time: 1315 End Time: 1400 Facilitators: Laneta Renea POUR, NT  Department: [facility]  Number of Participants: 2  Group Focus: coping skills Treatment Modality:  Psychoeducation Interventions utilized were problem solving Purpose: relapse prevention strategies  Name: Destiny Bottcher Date of Birth: Oct 03, 1970  MR: [id number]    Did not attend group.   Patients Problems:  Patient Active Problem List   Diagnosis Date Noted   Cocaine use disorder (HCC) 12/09/2023   Cocaine abuse (HCC) 06/17/2019   Substance induced mood disorder (HCC) 06/17/2019

## 2023-12-13 DIAGNOSIS — F1410 Cocaine abuse, uncomplicated: Secondary | ICD-10-CM | POA: Diagnosis not present

## 2023-12-13 DIAGNOSIS — M549 Dorsalgia, unspecified: Secondary | ICD-10-CM | POA: Diagnosis not present

## 2023-12-13 MED ORDER — VITAMIN D (ERGOCALCIFEROL) 1.25 MG (50000 UNIT) PO CAPS
50000.0000 [IU] | ORAL_CAPSULE | ORAL | Status: DC
  Administered 2023-12-13: 50000 [IU] via ORAL
  Filled 2023-12-13: qty 1

## 2023-12-13 NOTE — ED Notes (Signed)
 Patient A&Ox4. Denies intent to harm self/others when asked. Denies A/VH. Patient denies any physical complaints when asked. No acute distress noted. Support and encouragement provided. Routine safety checks conducted according to facility protocol. Encouraged patient to notify staff if thoughts of harm toward self or others arise. Patient verbalize understanding and agreement. Will continue to monitor for safety.

## 2023-12-13 NOTE — ED Notes (Addendum)
 Pt sitting in dayroom watching television. No acute distress noted. No concerns voiced. Pt received new medication ordered (Vit D) and tolerated well. Education provided on indication and SE of medication. Informed pt to notify staff with any needs or assistance. Pt verbalized understanding and agreement. Will continue to monitor for safety.

## 2023-12-13 NOTE — ED Provider Notes (Signed)
 Behavioral Health Progress Note  Date and Time: 12/13/2023 12:42 PM Name: Patrick Solomon MRN:  [id number] Diagnosis:  Final diagnoses:  Cocaine use disorder (HCC)  Cocaine abuse with cocaine-induced mood disorder (HCC)  Vitamin D deficiency   HPI: Patrick Solomon is a 53 y.o. male with a history of MDD & cocaine use disorder who presented to the [facility] on 12/09/2023 requesting assistance with quitting cocaine abuse; Pt stated to admissions provider: I am here because I want to stop smoking crack. Pt was was [facility] for treatment and stabilization with an intent of being referred to a longer term substance abuse treatment program.  24-hour chart review: Chart reviewed, case discussed with treatment team.  There were no behavioral issues noted in the past 24 hours.  Patient is remaining compliant with medications.  Vitals are within normal limits (105/83 earlier today morning).  Only complaint continues to be back pain which he states is from sciatica nerve damage, but which is chronic.  Today's patient assessment note: On assessment today, pt presents with a euthymic mood, reports readiness for discharge tomorrow, denies signs or symptoms related to withdrawals from substances of abuse.  Continuing to verbalize motivation to sustain his sobriety, reports that he did not will do this by staying at home, and not going anywhere until he goes to rehabilitation at [facility] which is scheduled for 12/25/2023.  Patient reports a good social support system and his family, and home he states that he will be with until that day.    Pt currently denies SI/HI/AVH or paranoia. There is no evidence of delusional thoughts.  Rates back pain as a 2 (10 being worst).  Patient is not on any psychotropic medications with the exception of trazodone which she is taking only as needed for sleep.  He is reporting a good sleep quality, reports a good appetite, reports that  energy level is normal, denies any issues with bowel movements, verbalizing readiness for discharge tomorrow.  Results Review: Vitamin D is very low at 9.90, will supplement with vitamin D 50,000 units weekly.  Total Time spent with patient: 45 minutes  Past Psychiatric History: MDD Past Medical History: htn Family History: denies Family Psychiatric  History: Denies Social History: Denies having a job currently    Sleep: Fair  Appetite:  Fair  Current Medications:  Current Facility-Administered Medications  Medication Dose Route Frequency Provider Last Rate Last Admin   acetaminophen  (TYLENOL ) tablet 650 mg  650 mg Oral Q6H PRN Dasie Ellouise CROME, FNP   650 mg at 12/12/23 0924   alum & mag hydroxide-simeth (MAALOX/MYLANTA) 200-200-20 MG/5ML suspension 30 mL  30 mL Oral Q4H PRN Dasie Ellouise CROME, FNP       amLODipine  (NORVASC ) tablet 10 mg  10 mg Oral Daily Dasie Ellouise CROME, FNP   10 mg at 12/13/23 0951   haloperidol (HALDOL) tablet 5 mg  5 mg Oral TID PRN Dasie Ellouise CROME, FNP       And   diphenhydrAMINE (BENADRYL) capsule 50 mg  50 mg Oral TID PRN Allen, Tina L, FNP       haloperidol lactate (HALDOL) injection 5 mg  5 mg Intramuscular TID PRN Allen, Tina L, FNP       And   diphenhydrAMINE (BENADRYL) injection 50 mg  50 mg Intramuscular TID PRN Allen, Tina L, FNP       And   LORazepam (ATIVAN) injection 2 mg  2 mg Intramuscular TID PRN  Dasie Ellouise CROME, FNP       haloperidol lactate (HALDOL) injection 10 mg  10 mg Intramuscular TID PRN Allen, Tina L, FNP       And   diphenhydrAMINE (BENADRYL) injection 50 mg  50 mg Intramuscular TID PRN Allen, Tina L, FNP       And   LORazepam (ATIVAN) injection 2 mg  2 mg Intramuscular TID PRN Allen, Tina L, FNP       hydrOXYzine (ATARAX) tablet 25 mg  25 mg Oral TID PRN Allen, Tina L, FNP   25 mg at 12/11/23 2121   magnesium hydroxide (MILK OF MAGNESIA) suspension 30 mL  30 mL Oral Daily PRN Allen, Tina L, FNP       naproxen  (NAPROSYN ) tablet 500 mg  500 mg Oral  BID PRN Tex Drilling, NP   500 mg at 12/10/23 2132   traZODone (DESYREL) tablet 50 mg  50 mg Oral QHS PRN Allen, Tina L, FNP   50 mg at 12/12/23 2124   Current Outpatient Medications  Medication Sig Dispense Refill   albuterol  (VENTOLIN  HFA) 108 (90 Base) MCG/ACT inhaler Inhale 2 puffs into the lungs every 6 (six) hours as needed for wheezing or shortness of breath. (Patient not taking: Reported on 04/15/2023) 8 g 2   amLODipine  (NORVASC ) 10 MG tablet Take 1 tablet (10 mg total) by mouth daily. (Patient not taking: Reported on 12/09/2023) 90 tablet 1    Labs  Lab Results:  Admission on 12/09/2023  Component Date Value Ref Range Status   Vit D, 25-Hydroxy 12/12/2023 9.90 (L)  30 - 100 ng/mL Final   Comment: (NOTE) Vitamin D deficiency has been defined by the Institute of Medicine  and an Endocrine Society practice guideline as a level of serum 25-OH  vitamin D less than 20 ng/mL (1,2). The Endocrine Society went on to  further define vitamin D insufficiency as a level between 21 and 29  ng/mL (2).  1. IOM (Institute of Medicine). 2010. Dietary reference intakes for  calcium and D. [place]: [employer]. 2. Holick MF, Binkley Sumner, Bischoff-Ferrari HA, et al. Evaluation,  treatment, and prevention of vitamin D deficiency: an Endocrine  Society clinical practice guideline, JCEM. 2011 Jul; 96(7): 1911-30.  Performed at [facility], [address], [place], [place] [address]    Cholesterol 12/12/2023 149  0 - 200 mg/dL Final   Triglycerides 88/93/7974 57  <150 mg/dL Final   HDL 88/93/7974 63  >40 mg/dL Final   Total CHOL/HDL Ratio 12/12/2023 2.4  RATIO Final   VLDL 12/12/2023 11  0 - 40 mg/dL Final   LDL Cholesterol 12/12/2023 75  0 - 99 mg/dL Final   Comment:        Total Cholesterol/HDL:CHD Risk Coronary Heart Disease Risk Table                     Men   Women  1/2 Average Risk   3.4   3.3  Average Risk       5.0   4.4  2 X Average Risk   9.6   7.1   3 X Average Risk  23.4   11.0        Use the calculated Patient Ratio above and the CHD Risk Table to determine the patient's CHD Risk.        ATP III CLASSIFICATION (LDL):  <100     mg/dL   Optimal  899-870  mg/dL  Near or Above                    Optimal  130-159  mg/dL   Borderline  839-810  mg/dL   High  >809     mg/dL   Very High Performed at [facility], [address], [place], [place] [address]    Vitamin B-12 12/12/2023 423  180 - 914 pg/mL Final   Comment: (NOTE) This assay is not validated for testing neonatal or myeloproliferative syndrome specimens for Vitamin B12 levels. Performed at [facility], [address], [place], [place] [address]   Admission on 12/09/2023, Discharged on 12/09/2023  Component Date Value Ref Range Status   WBC 12/09/2023 5.3  4.0 - 10.5 K/uL Final   RBC 12/09/2023 5.25  4.22 - 5.81 MIL/uL Final   Hemoglobin 12/09/2023 15.0  13.0 - 17.0 g/dL Final   HCT 88/96/7974 45.8  39.0 - 52.0 % Final   MCV 12/09/2023 87.2  80.0 - 100.0 fL Final   MCH 12/09/2023 28.6  26.0 - 34.0 pg Final   MCHC 12/09/2023 32.8  30.0 - 36.0 g/dL Final   RDW 88/96/7974 13.2  11.5 - 15.5 % Final   Platelets 12/09/2023 292  150 - 400 K/uL Final   nRBC 12/09/2023 0.0  0.0 - 0.2 % Final   Neutrophils Relative % 12/09/2023 43  % Final   Neutro Abs 12/09/2023 2.3  1.7 - 7.7 K/uL Final   Lymphocytes Relative 12/09/2023 34  % Final   Lymphs Abs 12/09/2023 1.8  0.7 - 4.0 K/uL Final   Monocytes Relative 12/09/2023 22  % Final   Monocytes Absolute 12/09/2023 1.2 (H)  0.1 - 1.0 K/uL Final   Eosinophils Relative 12/09/2023 0  % Final   Eosinophils Absolute 12/09/2023 0.0  0.0 - 0.5 K/uL Final   Basophils Relative 12/09/2023 1  % Final   Basophils Absolute 12/09/2023 0.0  0.0 - 0.1 K/uL Final   Immature Granulocytes 12/09/2023 0  % Final   Abs Immature Granulocytes 12/09/2023 0.02  0.00 - 0.07 K/uL Final   Performed at [facility], [address]  [place], [place] [address]   Sodium 12/09/2023 138  135 - 145 mmol/L Final   Potassium 12/09/2023 4.8  3.5 - 5.1 mmol/L Final   Chloride 12/09/2023 102  98 - 111 mmol/L Final   CO2 12/09/2023 24  22 - 32 mmol/L Final   Glucose, Bld 12/09/2023 77  70 - 99 mg/dL Final   Glucose reference range applies only to samples taken after fasting for at least 8 hours.   BUN 12/09/2023 12  6 - 20 mg/dL Final   Creatinine, Ser 12/09/2023 1.07  0.61 - 1.24 mg/dL Final   Calcium 88/96/7974 9.2  8.9 - 10.3 mg/dL Final   Total Protein 88/96/7974 7.7  6.5 - 8.1 g/dL Final   Albumin 88/96/7974 4.1  3.5 - 5.0 g/dL Final   AST 88/96/7974 28  15 - 41 U/L Final   ALT 12/09/2023 19  0 - 44 U/L Final   Alkaline Phosphatase 12/09/2023 50  38 - 126 U/L Final   Total Bilirubin 12/09/2023 0.3  0.0 - 1.2 mg/dL Final   GFR, Estimated 12/09/2023 >60  >60 mL/min Final   Comment: (NOTE) Calculated using the CKD-EPI Creatinine Equation (2021)    Anion gap 12/09/2023 12  5 - 15 Final   Performed at [facility], [address], [place], [place]  [address]   Alcohol, Ethyl (B) 12/09/2023 <15  <15 mg/dL Final   Comment: (NOTE) For medical purposes only. Performed at [facility], [address], [place], [place] [address]    Magnesium 12/09/2023 2.1  1.7 - 2.4 mg/dL Final   Performed at [facility], [address] [place], [place] [address]   TSH 12/09/2023 1.964  0.350 - 4.500 uIU/mL Final   Comment: Performed by a 3rd Generation assay with a functional sensitivity of <=0.01 uIU/mL. Performed at [facility], [address], [place], [place] [address]    POC Amphetamine UR 12/09/2023 None Detected  NONE DETECTED (Cut Off Level 1000 ng/mL) Final   POC Secobarbital (BAR) 12/09/2023 None Detected  NONE DETECTED (Cut Off Level 300 ng/mL) Final   POC Buprenorphine (BUP) 12/09/2023 None Detected  NONE DETECTED (Cut Off Level 10 ng/mL) Final   POC Oxazepam (BZO) 12/09/2023 None Detected  NONE DETECTED  (Cut Off Level 300 ng/mL) Final   POC Cocaine UR 12/09/2023 None Detected  NONE DETECTED (Cut Off Level 300 ng/mL) Final   POC Methamphetamine UR 12/09/2023 None Detected  NONE DETECTED (Cut Off Level 1000 ng/mL) Final   POC Morphine  12/09/2023 None Detected  NONE DETECTED (Cut Off Level 300 ng/mL) Final   POC Methadone UR 12/09/2023 None Detected  NONE DETECTED (Cut Off Level 300 ng/mL) Final   POC Oxycodone  UR 12/09/2023 None Detected  NONE DETECTED (Cut Off Level 100 ng/mL) Final   POC Marijuana UR 12/09/2023 Positive (A)  NONE DETECTED (Cut Off Level 50 ng/mL) Final    Blood Alcohol level:  Lab Results  Component Value Date   ETH <15 12/09/2023   ETH 201 (H) 06/16/2019    Metabolic Disorder Labs: No results found for: HGBA1C, MPG No results found for: PROLACTIN Lab Results  Component Value Date   CHOL 149 12/12/2023   TRIG 57 12/12/2023   HDL 63 12/12/2023   CHOLHDL 2.4 12/12/2023   VLDL 11 12/12/2023   LDLCALC 75 12/12/2023   LDLCALC 72 04/13/2019    Therapeutic Lab Levels: No results found for: LITHIUM No results found for: VALPROATE No results found for: CBMZ  Physical Findings   GAD-7    Flowsheet Row Office Visit from 01/14/2023 in [facility] Office Visit from 08/09/2020 in [facility] Office Visit from 05/25/2019 in [facility] Office Visit from 04/13/2019 in [facility]  Total GAD-7 Score 0 14 16 16    PHQ2-9    Flowsheet Row ED from 12/09/2023 in [facility] Most recent reading at 12/13/2023 12:32 PM ED from 12/09/2023 in [facility] Most recent reading at 12/09/2023  1:21 PM Office Visit from 02/12/2023 in [facility] Most recent reading at 02/12/2023  3:30 PM Office Visit from 01/14/2023 in [facility] Most recent reading at 01/14/2023  2:37 PM Office Visit from 08/09/2020 in  [facility] Most recent reading at 08/09/2020  4:01 PM  PHQ-2 Total Score 0 2 0 0 2  PHQ-9 Total Score 0 10 -- 10 14   Flowsheet Row ED from 12/09/2023 in [facility] ED from 01/05/2021 in [facility] Emergency Department at [facility] Office Visit from 08/09/2020 in [facility]  C-SSRS RISK CATEGORY No Risk No Risk Error: Question 1 not populated     Musculoskeletal  Strength &  Muscle Tone: within normal limits Gait & Station: normal Patient leans: N/A  Psychiatric Specialty Exam  Presentation  General Appearance:  Casual; Fairly Groomed  Eye Contact: Fair  Speech: Clear and Coherent  Speech Volume: Normal  Handedness: Right   Mood and Affect  Mood: Euthymic  Affect: Congruent   Thought Process  Thought Processes: Coherent  Descriptions of Associations:Intact  Orientation:Full (Time, Place and Person)  Thought Content:Logical; WDL     Hallucinations:Hallucinations: None  Ideas of Reference:None  Suicidal Thoughts:Suicidal Thoughts: No  Homicidal Thoughts:Homicidal Thoughts: No   Sensorium  Memory: Immediate Fair  Judgment: Good  Insight: Good   Executive Functions  Concentration: Good  Attention Span: Good  Recall: Good  Fund of Knowledge: Good  Language: Good   Psychomotor Activity  Psychomotor Activity: Psychomotor Activity: Normal   Assets  Assets: Resilience   Sleep  Sleep: Sleep: Good  Estimated Sleeping Duration (Last 24 Hours): 7.75-9.75 hours (Due to Daylight Saving Time, the durations displayed may not accurately represent documentation during the time change interval)  Nutritional Assessment (For OBS and FBC admissions only) Has the patient had a weight loss or gain of 10 pounds or more in the last 3 months?: No Has the patient had a decrease in food intake/or appetite?: No Does the patient have dental problems?:  No Does the patient have eating habits or behaviors that may be indicators of an eating disorder including binging or inducing vomiting?: No Has the patient recently lost weight without trying?: 0 Has the patient been eating poorly because of a decreased appetite?: 0 Malnutrition Screening Tool Score: 0     Physical Exam  Physical Exam Vitals and nursing note reviewed.  Constitutional:      Appearance: Normal appearance.  HENT:     Nose: Nose normal.  Eyes:     Pupils: Pupils are equal, round, and reactive to light.  Musculoskeletal:        General: Normal range of motion.     Cervical back: Normal range of motion.  Skin:    General: Skin is warm.  Neurological:     General: No focal deficit present.     Mental Status: He is alert and oriented to person, place, and time.  Psychiatric:        Thought Content: Thought content normal.    Review of Systems  Cardiovascular: Negative.   Psychiatric/Behavioral:  Positive for substance abuse and suicidal ideas. Negative for depression, hallucinations and memory loss. The patient is not nervous/anxious and does not have insomnia.   All other systems reviewed and are negative.  Blood pressure 105/83, pulse 80, temperature 98.3 F (36.8 C), temperature source Oral, resp. rate 17, SpO2 100%. There is no height or weight on file to calculate BMI.  Treatment Plan Summary: Daily contact with patient to assess and evaluate symptoms and progress in treatment and Medication management Treatment Plan Summary: Daily contact with patient to assess and evaluate symptoms and progress in treatment and Medication management  Safety and Monitoring: Voluntary admission to inpatient psychiatric unit for safety, stabilization and treatment Daily contact with patient to assess and evaluate symptoms and progress in treatment Patient's case to be discussed in multi-disciplinary team meeting Observation Level : q15 minute checks Vital signs: q12  hours Precautions: Safety  Long Term Goal(s): Improvement in symptoms so as ready for discharge  Short Term Goals: Ability to identify changes in lifestyle to reduce recurrence of condition will improve, Ability to verbalize feelings will improve, Ability to disclose  and discuss suicidal ideas, Ability to demonstrate self-control will improve, Ability to identify and develop effective coping behaviors will improve, Ability to maintain clinical measurements within normal limits will improve, Compliance with prescribed medications will improve, and Ability to identify triggers associated with substance abuse/mental health issues will improve  Diagnoses Principal Problem:   Cocaine use disorder (HCC)  Medications -Start Vitamin D 50.000 units for low Vit D levels  - Continue Norvasc  10 mg daily for hypertension   PRNS -Continue naproxen  500 mg twice daily as needed for back pain -Continue Tylenol  650 mg every 6 hours PRN for mild pain -Continue hydroxyzine 25 mg 3 times daily as needed for anxiety -Continue agitation protocol medications: Ativan/Benadryl/Haldol 3 times daily as needed for agitation -Continue trazodone 50 mg nightly as needed for sleep -Continue Maalox 30 mg every 4 hrs PRN for indigestion -Continue Milk of Magnesia as needed every 6 hrs for constipation  Discharge Planning: Social work and case management to assist with discharge planning and identification of hospital follow-up needs prior to discharge Estimated LOS: 5-7 days Discharge Concerns: Need to establish a safety plan; Medication compliance and effectiveness Discharge Goals: Return home with outpatient referrals for mental health follow-up including medication management/psychotherapy  I certify that inpatient services furnished can reasonably be expected to improve the patient's condition.    Total Time Spent in Direct Patient Care:  I personally spent 45 minutes on the unit in direct patient care. The direct  patient care time included face-to-face time with the patient, reviewing the patient's chart, communicating with other professionals, and coordinating care. Greater than 50% of this time was spent in counseling or coordinating care with the patient regarding goals of hospitalization, psycho-education, and discharge planning needs.    Donia Snell, NP 11/7/202512:42 PM    Donia Snell, NP 12/13/2023 12:42 PM

## 2023-12-13 NOTE — Group Note (Signed)
 Group Topic: Positive Affirmations  Group Date: 12/13/2023 Start Time: 1345 End Time: 1430 Facilitators: Laneta Renea POUR, NT  Department: [facility]  Number of Participants: 4  Group Focus: coping skills Treatment Modality:  Psychoeducation Interventions utilized were problem solving Purpose: increase insight  Name: Patrick Solomon Date of Birth: [birth year]  MR: [id number]    Did not attend group.   Patients Problems:  Patient Active Problem List   Diagnosis Date Noted   Cocaine use disorder (HCC) 12/09/2023   Cocaine abuse (HCC) 06/17/2019   Substance induced mood disorder (HCC) 06/17/2019

## 2023-12-13 NOTE — ED Notes (Signed)
 Pt sitting in dayroom watching television and interacting with peers. No acute distress noted. No concerns voiced. Informed pt to notify staff with any needs or assistance. Pt verbalized understanding and agreement. Will continue to monitor for safety.

## 2023-12-13 NOTE — ED Notes (Signed)
 Attended [facility].

## 2023-12-13 NOTE — Group Note (Signed)
 Group Topic: Feelings about Diagnosis  Group Date: 12/13/2023 Start Time: 1930 End Time: 2000 Facilitators: Verdon Jacqualyn BRAVO, NT  Department: [facility]  Number of Participants: 5  Group Focus: clarity of thought Treatment Modality:  Individual Therapy Interventions utilized were group exercise Purpose: express feelings  Name: Patrick Solomon Date of Birth: 1970/06/05  MR: [id number]    Level of Participation: active Quality of Participation: cooperative Interactions with others: gave feedback Mood/Affect: appropriate Triggers (if applicable): n/a Cognition: coherent/clear Progress: Moderate Response: n/a Plan: follow-up needed  Patients Problems:  Patient Active Problem List   Diagnosis Date Noted   Cocaine use disorder (HCC) 12/09/2023   Cocaine abuse (HCC) 06/17/2019   Substance induced mood disorder (HCC) 06/17/2019

## 2023-12-13 NOTE — Group Note (Signed)
 Group Topic: Recovery Basics  Group Date: 12/13/2023 Start Time: 1700 End Time: 1720 Facilitators: Herold Lajuana NOVAK, RN  Department: [facility]  Number of Participants: 4  Group Focus: coping skills Treatment Modality:  Leisure Development Interventions utilized were exploration Purpose: enhance coping skills  Name: Patrick Solomon Date of Birth: [birth year]  MR: [id number]    Level of Participation: active Quality of Participation: attentive Interactions with others: gave feedback Mood/Affect: appropriate Triggers (if applicable): none identified Cognition: coherent/clear Progress: Significant Response: I just kneel and pray to get my mind off of using cocaine Plan: patient will be encouraged to seek help and attend NA group meetings upon discharge  Patients Problems:  Patient Active Problem List   Diagnosis Date Noted   Cocaine use disorder (HCC) 12/09/2023   Cocaine abuse (HCC) 06/17/2019   Substance induced mood disorder (HCC) 06/17/2019

## 2023-12-13 NOTE — ED Notes (Signed)
 Pt is sleeping. Respirations are even and labored. Q15 safety checks in place.

## 2023-12-13 NOTE — [facility] Group Notes (Signed)
 Spiritual Care and Counseling Group Note  12/13/2023 2:45pm  Facilitated by: Librada Donnice Lin   Type of Therapy and Topic:  Hope    Participation Level:  Active  Description of Group:  Group focused on topic of hope.  Patients participated in facilitated discussion around topic, connecting with one another around experiences and definitions for hope.  Group members engaged with group word cloud.  Members selected an image of what hope looks like for them today.  Group engaged in discussion around how their definitions of hope are present today in hospital.      Summary of Patient Progress:  Patrick Solomon was present throughout group.  Actively participated in group discussion.    Therapeutic Modalities: Psycho-social ed, Adlerian, Narrative, MI   Librada Donnice Lin, Chaplain 12/13/2023 4:52 PM

## 2023-12-14 DIAGNOSIS — F1410 Cocaine abuse, uncomplicated: Secondary | ICD-10-CM | POA: Diagnosis not present

## 2023-12-14 DIAGNOSIS — M549 Dorsalgia, unspecified: Secondary | ICD-10-CM | POA: Diagnosis not present

## 2023-12-14 DIAGNOSIS — E559 Vitamin D deficiency, unspecified: Secondary | ICD-10-CM

## 2023-12-14 DIAGNOSIS — F1414 Cocaine abuse with cocaine-induced mood disorder: Secondary | ICD-10-CM

## 2023-12-14 MED ORDER — AMLODIPINE BESYLATE 10 MG PO TABS: 10.0000 mg | ORAL_TABLET | Freq: Every day | ORAL | 0 refills | Status: AC

## 2023-12-14 MED ORDER — HYDROXYZINE HCL 25 MG PO TABS: 25.0000 mg | ORAL_TABLET | Freq: Three times a day (TID) | ORAL | 0 refills | Status: AC | PRN

## 2023-12-14 MED ORDER — ALBUTEROL SULFATE HFA 108 (90 BASE) MCG/ACT IN AERS: 2.0000 | INHALATION_SPRAY | Freq: Four times a day (QID) | RESPIRATORY_TRACT | Status: AC | PRN

## 2023-12-14 MED ORDER — TRAZODONE HCL 50 MG PO TABS: 50.0000 mg | ORAL_TABLET | Freq: Every evening | ORAL | 0 refills | Status: AC | PRN

## 2023-12-14 NOTE — ED Notes (Signed)
 Pt has gone to bed. No complains made.

## 2023-12-14 NOTE — ED Notes (Signed)
 Pt is sleeping. No acute distress noted. Q15 safety checks in place.

## 2023-12-14 NOTE — ED Provider Notes (Signed)
 FBC/OBS ASAP Discharge Summary  Date and Time: 12/14/2023 11:57 AM  Name: Patrick Solomon  MRN:  [id number]   Discharge Diagnoses:  Final diagnoses:  Cocaine use disorder (HCC)  Cocaine abuse with cocaine-induced mood disorder (HCC)  Vitamin D deficiency   Subjective: Patient happy to discharge home today. Did not sleep well overnight despite trazodone PRN. He denies insomnia issues at home so plan is to discharge. Patient denies drug cravings. He has positive outlook on transition to [facility] 11/19. Patient requested review of heart/kidney healthy diet. We also reviewed vitamin D deficiency and appropriate multimodal treatment. Patient requested expedited discharge this morning.   Stay Summary: Patient engaged in multimodal treatment for polysubstance use disorder (primarily cocaine). Patient quickly adjusted to milieu. The patient was evaluated each day by a clinical provider to ascertain response to treatment. Improvement was noted by the patient's report of decreasing symptoms, improved sleep and appetite, affect, medication tolerance, behavior, and participation in unit programming. Patient's care was discussed during the interdisciplinary team meeting every day during the hospitalization. Patient was asked each day to complete a self inventory noting mood, mental status, pain, new symptoms, anxiety and concerns. Labs were reviewed with the patient, and abnormal results were discussed with the patient (primarily low vitamin D and elevated creatinine).   Symptoms were reported as significantly decreased or resolved completely by discharge.     The patient denied having side effects to prescribed psychiatric medication.  Total Time spent with patient: I personally spent 30 minutes on the unit in direct patient care. The direct patient care time included face-to-face time with the patient, reviewing the patient's chart, communicating with other professionals, and coordinating care. Greater than  50% of this time was spent in counseling or coordinating care with the patient regarding goals of hospitalization, psycho-education, and discharge planning needs.   On my assessment the patient denied SI, HI, AVH, paranoia, ideas of reference, or first rank symptoms on day of discharge. Patient denied drug cravings or active signs of withdrawal. Patient denied medication side-effects. Patient was not deemed to be a danger to self or others on day of discharge and was in agreement with discharge plans.   Triage HPI: Patrick Solomon is a 53yo male presenting voluntarily to [facility] for [facility] assessment. Pt stated that he would like help with Substance abuse. Pt reported he has been smoking crack cocaine 1x every two weeks when he gets paid from work. 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 Pt stated that he is dealing with some stress from his children. pt stated he has 10 adult children and they all call him with their problems and he often feels like he has to fix them. Also two of the mothers of his children call him and it caused friction between he and his girlfriend. Pt stated he last used crack cocaine last week on Friday. pt stated he has been using ETOH only on the weekend around 2 40oz beers. pt last used ETOH yesterday around 7pm 1 40oz beer. no reports of prior or current withdrawal symptoms. pt did state he used THC about 2 days ago because he didn't have any crack and he had not used THC in years prior to that. Pt stated has no serious medical issues other than sciatica, he reported no SI/HI/AVH past or present. pt did mention he was released from prison last October and was assessed and had to do a drug class for 8 months but doesn't  remember the name of the treatment center. otherwise no history of MH or SA treatment in the past. pt calm, good eye contact, well groomed, dressed casually, cooperative.    Past Psychiatric History: MDD, cocaine use disorder, cannabis use Past  Medical History: HTN Family History: denies Family Psychiatric  History: Denies Social History: Recently quit job, lives with wife  Current Medications:  Current Facility-Administered Medications  Medication Dose Route Frequency Provider Last Rate Last Admin   acetaminophen  (TYLENOL ) tablet 650 mg  650 mg Oral Q6H PRN Dasie Ellouise CROME, FNP   650 mg at 12/12/23 0924   alum & mag hydroxide-simeth (MAALOX/MYLANTA) 200-200-20 MG/5ML suspension 30 mL  30 mL Oral Q4H PRN Dasie Ellouise CROME, FNP       amLODipine  (NORVASC ) tablet 10 mg  10 mg Oral Daily Dasie Ellouise CROME, FNP   10 mg at 12/14/23 0925   haloperidol (HALDOL) tablet 5 mg  5 mg Oral TID PRN Dasie Ellouise CROME, FNP       And   diphenhydrAMINE (BENADRYL) capsule 50 mg  50 mg Oral TID PRN Allen, Tina L, FNP       haloperidol lactate (HALDOL) injection 5 mg  5 mg Intramuscular TID PRN Allen, Tina L, FNP       And   diphenhydrAMINE (BENADRYL) injection 50 mg  50 mg Intramuscular TID PRN Allen, Tina L, FNP       And   LORazepam (ATIVAN) injection 2 mg  2 mg Intramuscular TID PRN Dasie Ellouise CROME, FNP       haloperidol lactate (HALDOL) injection 10 mg  10 mg Intramuscular TID PRN Allen, Tina L, FNP       And   diphenhydrAMINE (BENADRYL) injection 50 mg  50 mg Intramuscular TID PRN Allen, Tina L, FNP       And   LORazepam (ATIVAN) injection 2 mg  2 mg Intramuscular TID PRN Allen, Tina L, FNP       hydrOXYzine (ATARAX) tablet 25 mg  25 mg Oral TID PRN Allen, Tina L, FNP   25 mg at 12/11/23 2121   magnesium hydroxide (MILK OF MAGNESIA) suspension 30 mL  30 mL Oral Daily PRN Allen, Tina L, FNP       naproxen  (NAPROSYN ) tablet 500 mg  500 mg Oral BID PRN Tex Drilling, NP   500 mg at 12/10/23 2132   traZODone (DESYREL) tablet 50 mg  50 mg Oral QHS PRN Allen, Tina L, FNP   50 mg at 12/13/23 2118   Vitamin D (Ergocalciferol) (DRISDOL) 1.25 MG (50000 UNIT) capsule 50,000 Units  50,000 Units Oral Q7 days Tex Drilling, NP   50,000 Units at 12/13/23 1323   Current  Outpatient Medications  Medication Sig Dispense Refill   albuterol  (VENTOLIN  HFA) 108 (90 Base) MCG/ACT inhaler Inhale 2 puffs into the lungs every 6 (six) hours as needed for wheezing or shortness of breath.     amLODipine  (NORVASC ) 10 MG tablet Take 1 tablet (10 mg total) by mouth daily. 30 tablet 0   hydrOXYzine (ATARAX) 25 MG tablet Take 1 tablet (25 mg total) by mouth 3 (three) times daily as needed for anxiety. 30 tablet 0   traZODone (DESYREL) 50 MG tablet Take 1 tablet (50 mg total) by mouth at bedtime as needed for sleep. 30 tablet 0    PTA Medications:  PTA Medications  Medication Sig   amLODipine  (NORVASC ) 10 MG tablet Take 1 tablet (10 mg total) by mouth daily.   hydrOXYzine (  ATARAX) 25 MG tablet Take 1 tablet (25 mg total) by mouth 3 (three) times daily as needed for anxiety.   traZODone (DESYREL) 50 MG tablet Take 1 tablet (50 mg total) by mouth at bedtime as needed for sleep.   albuterol  (VENTOLIN  HFA) 108 (90 Base) MCG/ACT inhaler Inhale 2 puffs into the lungs every 6 (six) hours as needed for wheezing or shortness of breath.   Facility Ordered Medications  Medication   acetaminophen  (TYLENOL ) tablet 650 mg   alum & mag hydroxide-simeth (MAALOX/MYLANTA) 200-200-20 MG/5ML suspension 30 mL   amLODipine  (NORVASC ) tablet 10 mg   hydrOXYzine (ATARAX) tablet 25 mg   magnesium hydroxide (MILK OF MAGNESIA) suspension 30 mL   traZODone (DESYREL) tablet 50 mg   haloperidol (HALDOL) tablet 5 mg   And   diphenhydrAMINE (BENADRYL) capsule 50 mg   haloperidol lactate (HALDOL) injection 5 mg   And   diphenhydrAMINE (BENADRYL) injection 50 mg   And   LORazepam (ATIVAN) injection 2 mg   haloperidol lactate (HALDOL) injection 10 mg   And   diphenhydrAMINE (BENADRYL) injection 50 mg   And   LORazepam (ATIVAN) injection 2 mg   naproxen  (NAPROSYN ) tablet 500 mg   Vitamin D (Ergocalciferol) (DRISDOL) 1.25 MG (50000 UNIT) capsule 50,000 Units       12/14/2023   10:00 AM 12/13/2023    12:32 PM 12/09/2023    3:57 PM  Depression screen PHQ 2/9  Decreased Interest 0 0 1  Down, Depressed, Hopeless 1 0 2  PHQ - 2 Score 1 0 3  Altered sleeping 1 0 2  Tired, decreased energy 0 0 1  Change in appetite 0 0 2  Feeling bad or failure about yourself  1 0 2  Trouble concentrating 0 0 1  Moving slowly or fidgety/restless 0 0 0  Suicidal thoughts 0 0 0  PHQ-9 Score 3 0 11   Difficult doing work/chores  Somewhat difficult Somewhat difficult     Data saved with a previous flowsheet row definition    Flowsheet Row ED from 12/09/2023 in [facility] ED from 01/05/2021 in [facility] at [facility] Office Visit from 08/09/2020 in [facility]  C-SSRS RISK CATEGORY No Risk No Risk Error: Question 1 not populated    Musculoskeletal  Strength & Muscle Tone: within normal limits Gait & Station: normal Patient leans: N/A  Psychiatric Specialty Exam  Presentation  General Appearance:  Appropriate for Environment  Eye Contact: Good  Speech: Clear and Coherent  Speech Volume: Normal  Handedness: Right   Mood and Affect  Mood: Euthymic  Affect: Congruent; Appropriate   Thought Process  Thought Processes: Coherent  Descriptions of Associations:Intact  Orientation:Full (Time, Place and Person)  Thought Content:Logical     Hallucinations:Hallucinations: None  Ideas of Reference:None  Suicidal Thoughts:Suicidal Thoughts: No  Homicidal Thoughts:Homicidal Thoughts: No   Sensorium  Memory: Immediate Fair; Recent Fair  Judgment: Good  Insight: Good   Executive Functions  Concentration: Good  Attention Span: Good  Recall: Fair  Fund of Knowledge: Good  Language: Good   Psychomotor Activity  Psychomotor Activity: Psychomotor Activity: Normal   Assets  Assets: Communication Skills; Desire for Improvement; Housing; Resilience; Social  Support   Sleep  Sleep: Sleep: Fair (initial insomnia, restless sleep)  Estimated Sleeping Duration (Last 24 Hours): 7.50-9.75 hours (Due to Daylight Saving Time, the durations displayed may not accurately represent documentation during the time change interval)  Nutritional Assessment (  For OBS and [facility] admissions only) Has the patient had a weight loss or gain of 10 pounds or more in the last 3 months?: No Has the patient had a decrease in food intake/or appetite?: No Does the patient have dental problems?: No Does the patient have eating habits or behaviors that may be indicators of an eating disorder including binging or inducing vomiting?: No Has the patient recently lost weight without trying?: 0 Has the patient been eating poorly because of a decreased appetite?: 0 Malnutrition Screening Tool Score: 0    Physical Exam  Physical Exam ROS Blood pressure 107/70, pulse 94, temperature 98.2 F (36.8 C), temperature source Oral, resp. rate 18, SpO2 96%. There is no height or weight on file to calculate BMI.  Demographic Factors:  Male, Low socioeconomic status, and Unemployed  Loss Factors: Financial problems/change in socioeconomic status  Historical Factors: Family history of mental illness or substance abuse and Impulsivity  Risk Reduction Factors:   Living with another person, especially a relative and Positive social support  Continued Clinical Symptoms:  Alcohol/Substance Abuse/Dependencies  Cognitive Features That Contribute To Risk:  None    Suicide Risk:  Minimal: No identifiable suicidal ideation.  Patients presenting with no risk factors but with morbid ruminations; may be classified as minimal risk based on the severity of the depressive symptoms  Plan Of Care/Follow-up recommendations:  Patient scheduled to discharge on Saturday 12/14/2023 by 11:00 am   Writer coordinated an appointment for Wednesday 12/25/2023 @ 8:30 am   [facility] -  [facility] [address] [place], [place] [address] [phone]   Activity: Daily physical activity; reduce sedentary behaviors   Diet: Portion-limited diet rich in produce, whole (minimally processed) grains, nuts/seeds, eggs, beans/legumes, seafood, Fairlife milk, fermented food, lean meat. Copious water intake. Limit (ultra)processed foods and sugar-sweetened beverages.    Other: -Follow-up with your outpatient psychiatric provider -instructions on appointment date, time, and address (location) are provided to you in discharge paperwork. [facility] 12/25/23   -Take your psychiatric medications as prescribed at discharge - instructions are provided to you in the discharge paperwork   -Follow-up with outpatient primary care doctor to optimize health maintenance/preventative care and other specialists -for management of chronic medical disease. Vitamin D deficiency - take 5000 units every day, get more sun, eat more foods that are good sources such as dairy (if tolerated) try Fairlife, leafy greens, sardines.   -Testing: Follow-up with outpatient provider for abnormal lab results: vitamin D, creatinine  -Recommend total abstinence from alcohol, tobacco, and other illicit drug use at discharge.    -If your psychiatric symptoms recur, worsen, or if you have side effects to your psychiatric medications, call your outpatient psychiatric provider, [phone] or go to the nearest emergency department.   -If suicidal thoughts occur, immediately call your outpatient psychiatric provider, [phone] or go to the nearest emergency department.  Disposition: discharge home  KANDI JAYSON HAHN, MD 12/14/2023, 11:57 AM
